# Patient Record
Sex: Male | Born: 1945
Health system: Southern US, Community
[De-identification: ages and names within clinical notes are randomized; demographics above are authoritative.]

## PROBLEM LIST (undated history)

## (undated) DIAGNOSIS — I10 Essential (primary) hypertension: Secondary | ICD-10-CM

## (undated) DIAGNOSIS — M810 Age-related osteoporosis without current pathological fracture: Secondary | ICD-10-CM

## (undated) HISTORY — PX: HERNIA REPAIR: SHX51

## (undated) HISTORY — PX: EYE SURGERY: SHX253

## (undated) HISTORY — PX: BLADDER SURGERY: SHX569

---

## 1999-10-01 ENCOUNTER — Encounter: Admission: RE | Admit: 1999-10-01 | Discharge: 1999-10-01 | Payer: Self-pay | Admitting: Internal Medicine

## 1999-10-01 ENCOUNTER — Encounter: Payer: Self-pay | Admitting: Internal Medicine

## 2002-06-24 ENCOUNTER — Ambulatory Visit (HOSPITAL_COMMUNITY): Admission: RE | Admit: 2002-06-24 | Discharge: 2002-06-24 | Payer: Self-pay | Admitting: *Deleted

## 2009-11-06 ENCOUNTER — Ambulatory Visit (HOSPITAL_BASED_OUTPATIENT_CLINIC_OR_DEPARTMENT_OTHER): Admission: RE | Admit: 2009-11-06 | Discharge: 2009-11-07 | Payer: Self-pay | Admitting: Urology

## 2010-06-08 ENCOUNTER — Other Ambulatory Visit: Payer: Self-pay | Admitting: Physician Assistant

## 2010-07-18 LAB — POCT I-STAT 4, (NA,K, GLUC, HGB,HCT)
Glucose, Bld: 95 mg/dL (ref 70–99)
HCT: 45 % (ref 39.0–52.0)
Hemoglobin: 15.3 g/dL (ref 13.0–17.0)
Potassium: 4.1 mEq/L (ref 3.5–5.1)
Sodium: 143 mEq/L (ref 135–145)

## 2011-08-10 DIAGNOSIS — R634 Abnormal weight loss: Secondary | ICD-10-CM | POA: Diagnosis not present

## 2011-08-10 DIAGNOSIS — I10 Essential (primary) hypertension: Secondary | ICD-10-CM | POA: Diagnosis not present

## 2011-08-10 DIAGNOSIS — E559 Vitamin D deficiency, unspecified: Secondary | ICD-10-CM | POA: Diagnosis not present

## 2011-08-10 DIAGNOSIS — Z79899 Other long term (current) drug therapy: Secondary | ICD-10-CM | POA: Diagnosis not present

## 2011-08-10 DIAGNOSIS — M810 Age-related osteoporosis without current pathological fracture: Secondary | ICD-10-CM | POA: Diagnosis not present

## 2011-10-28 DIAGNOSIS — Z7902 Long term (current) use of antithrombotics/antiplatelets: Secondary | ICD-10-CM | POA: Diagnosis not present

## 2011-10-28 DIAGNOSIS — M810 Age-related osteoporosis without current pathological fracture: Secondary | ICD-10-CM | POA: Diagnosis not present

## 2011-10-28 DIAGNOSIS — Z0000 Encounter for general adult medical examination without abnormal findings: Secondary | ICD-10-CM | POA: Diagnosis not present

## 2011-10-28 DIAGNOSIS — I10 Essential (primary) hypertension: Secondary | ICD-10-CM | POA: Diagnosis not present

## 2011-10-28 DIAGNOSIS — Z125 Encounter for screening for malignant neoplasm of prostate: Secondary | ICD-10-CM | POA: Diagnosis not present

## 2011-11-02 DIAGNOSIS — K449 Diaphragmatic hernia without obstruction or gangrene: Secondary | ICD-10-CM | POA: Diagnosis not present

## 2011-11-02 DIAGNOSIS — Z1212 Encounter for screening for malignant neoplasm of rectum: Secondary | ICD-10-CM | POA: Diagnosis not present

## 2011-11-02 DIAGNOSIS — Z87898 Personal history of other specified conditions: Secondary | ICD-10-CM | POA: Diagnosis not present

## 2011-11-02 DIAGNOSIS — I10 Essential (primary) hypertension: Secondary | ICD-10-CM | POA: Diagnosis not present

## 2011-11-02 DIAGNOSIS — Z0000 Encounter for general adult medical examination without abnormal findings: Secondary | ICD-10-CM | POA: Diagnosis not present

## 2011-11-02 DIAGNOSIS — M810 Age-related osteoporosis without current pathological fracture: Secondary | ICD-10-CM | POA: Diagnosis not present

## 2012-01-27 DIAGNOSIS — Z23 Encounter for immunization: Secondary | ICD-10-CM | POA: Diagnosis not present

## 2012-04-11 DIAGNOSIS — N3010 Interstitial cystitis (chronic) without hematuria: Secondary | ICD-10-CM | POA: Diagnosis not present

## 2012-06-22 DIAGNOSIS — J040 Acute laryngitis: Secondary | ICD-10-CM | POA: Diagnosis not present

## 2012-07-05 ENCOUNTER — Other Ambulatory Visit: Payer: Self-pay | Admitting: Physician Assistant

## 2012-07-05 DIAGNOSIS — L570 Actinic keratosis: Secondary | ICD-10-CM | POA: Diagnosis not present

## 2012-07-05 DIAGNOSIS — C44319 Basal cell carcinoma of skin of other parts of face: Secondary | ICD-10-CM | POA: Diagnosis not present

## 2012-07-05 DIAGNOSIS — D485 Neoplasm of uncertain behavior of skin: Secondary | ICD-10-CM | POA: Diagnosis not present

## 2012-07-05 DIAGNOSIS — L820 Inflamed seborrheic keratosis: Secondary | ICD-10-CM | POA: Diagnosis not present

## 2012-07-05 DIAGNOSIS — D2330 Other benign neoplasm of skin of unspecified part of face: Secondary | ICD-10-CM | POA: Diagnosis not present

## 2012-07-30 DIAGNOSIS — C44319 Basal cell carcinoma of skin of other parts of face: Secondary | ICD-10-CM | POA: Diagnosis not present

## 2012-08-01 DIAGNOSIS — C44319 Basal cell carcinoma of skin of other parts of face: Secondary | ICD-10-CM | POA: Diagnosis not present

## 2012-08-03 DIAGNOSIS — C44319 Basal cell carcinoma of skin of other parts of face: Secondary | ICD-10-CM | POA: Diagnosis not present

## 2012-08-06 DIAGNOSIS — C44319 Basal cell carcinoma of skin of other parts of face: Secondary | ICD-10-CM | POA: Diagnosis not present

## 2012-08-08 DIAGNOSIS — C44319 Basal cell carcinoma of skin of other parts of face: Secondary | ICD-10-CM | POA: Diagnosis not present

## 2012-08-10 DIAGNOSIS — C44319 Basal cell carcinoma of skin of other parts of face: Secondary | ICD-10-CM | POA: Diagnosis not present

## 2012-08-13 DIAGNOSIS — C44319 Basal cell carcinoma of skin of other parts of face: Secondary | ICD-10-CM | POA: Diagnosis not present

## 2012-08-15 DIAGNOSIS — C44319 Basal cell carcinoma of skin of other parts of face: Secondary | ICD-10-CM | POA: Diagnosis not present

## 2012-08-20 DIAGNOSIS — C44319 Basal cell carcinoma of skin of other parts of face: Secondary | ICD-10-CM | POA: Diagnosis not present

## 2012-08-22 DIAGNOSIS — C44319 Basal cell carcinoma of skin of other parts of face: Secondary | ICD-10-CM | POA: Diagnosis not present

## 2012-08-24 DIAGNOSIS — C44319 Basal cell carcinoma of skin of other parts of face: Secondary | ICD-10-CM | POA: Diagnosis not present

## 2012-08-27 DIAGNOSIS — C44319 Basal cell carcinoma of skin of other parts of face: Secondary | ICD-10-CM | POA: Diagnosis not present

## 2012-08-31 DIAGNOSIS — C44319 Basal cell carcinoma of skin of other parts of face: Secondary | ICD-10-CM | POA: Diagnosis not present

## 2012-10-01 ENCOUNTER — Other Ambulatory Visit: Payer: Self-pay | Admitting: Dermatology

## 2012-10-01 DIAGNOSIS — C44319 Basal cell carcinoma of skin of other parts of face: Secondary | ICD-10-CM | POA: Diagnosis not present

## 2012-11-22 ENCOUNTER — Other Ambulatory Visit: Payer: Self-pay | Admitting: Dermatology

## 2012-11-22 DIAGNOSIS — D0439 Carcinoma in situ of skin of other parts of face: Secondary | ICD-10-CM | POA: Diagnosis not present

## 2012-11-22 DIAGNOSIS — D0430 Carcinoma in situ of skin of unspecified part of face: Secondary | ICD-10-CM | POA: Diagnosis not present

## 2012-11-22 DIAGNOSIS — C44319 Basal cell carcinoma of skin of other parts of face: Secondary | ICD-10-CM | POA: Diagnosis not present

## 2013-01-17 DIAGNOSIS — E7800 Pure hypercholesterolemia, unspecified: Secondary | ICD-10-CM | POA: Diagnosis not present

## 2013-01-17 DIAGNOSIS — Z7982 Long term (current) use of aspirin: Secondary | ICD-10-CM | POA: Diagnosis not present

## 2013-01-17 DIAGNOSIS — I10 Essential (primary) hypertension: Secondary | ICD-10-CM | POA: Diagnosis not present

## 2013-01-17 DIAGNOSIS — Z125 Encounter for screening for malignant neoplasm of prostate: Secondary | ICD-10-CM | POA: Diagnosis not present

## 2013-01-17 DIAGNOSIS — Z0000 Encounter for general adult medical examination without abnormal findings: Secondary | ICD-10-CM | POA: Diagnosis not present

## 2013-01-18 DIAGNOSIS — Z23 Encounter for immunization: Secondary | ICD-10-CM | POA: Diagnosis not present

## 2013-01-23 DIAGNOSIS — I10 Essential (primary) hypertension: Secondary | ICD-10-CM | POA: Diagnosis not present

## 2013-01-23 DIAGNOSIS — M810 Age-related osteoporosis without current pathological fracture: Secondary | ICD-10-CM | POA: Diagnosis not present

## 2013-01-23 DIAGNOSIS — R350 Frequency of micturition: Secondary | ICD-10-CM | POA: Diagnosis not present

## 2013-01-23 DIAGNOSIS — Z7982 Long term (current) use of aspirin: Secondary | ICD-10-CM | POA: Diagnosis not present

## 2013-01-23 DIAGNOSIS — E291 Testicular hypofunction: Secondary | ICD-10-CM | POA: Diagnosis not present

## 2013-01-23 DIAGNOSIS — L570 Actinic keratosis: Secondary | ICD-10-CM | POA: Diagnosis not present

## 2013-01-23 DIAGNOSIS — Z1212 Encounter for screening for malignant neoplasm of rectum: Secondary | ICD-10-CM | POA: Diagnosis not present

## 2013-01-23 DIAGNOSIS — E7800 Pure hypercholesterolemia, unspecified: Secondary | ICD-10-CM | POA: Diagnosis not present

## 2013-02-13 DIAGNOSIS — H2510 Age-related nuclear cataract, unspecified eye: Secondary | ICD-10-CM | POA: Diagnosis not present

## 2013-02-13 DIAGNOSIS — H35039 Hypertensive retinopathy, unspecified eye: Secondary | ICD-10-CM | POA: Diagnosis not present

## 2013-02-26 ENCOUNTER — Other Ambulatory Visit: Payer: Self-pay | Admitting: Dermatology

## 2013-02-26 DIAGNOSIS — L570 Actinic keratosis: Secondary | ICD-10-CM | POA: Diagnosis not present

## 2013-02-26 DIAGNOSIS — D485 Neoplasm of uncertain behavior of skin: Secondary | ICD-10-CM | POA: Diagnosis not present

## 2013-03-04 DIAGNOSIS — D2310 Other benign neoplasm of skin of unspecified eyelid, including canthus: Secondary | ICD-10-CM | POA: Diagnosis not present

## 2013-03-19 DIAGNOSIS — I10 Essential (primary) hypertension: Secondary | ICD-10-CM | POA: Diagnosis not present

## 2013-03-19 DIAGNOSIS — M810 Age-related osteoporosis without current pathological fracture: Secondary | ICD-10-CM | POA: Diagnosis not present

## 2013-03-19 DIAGNOSIS — E291 Testicular hypofunction: Secondary | ICD-10-CM | POA: Diagnosis not present

## 2013-03-19 DIAGNOSIS — R972 Elevated prostate specific antigen [PSA]: Secondary | ICD-10-CM | POA: Diagnosis not present

## 2013-03-19 DIAGNOSIS — E559 Vitamin D deficiency, unspecified: Secondary | ICD-10-CM | POA: Diagnosis not present

## 2013-03-25 DIAGNOSIS — C44111 Basal cell carcinoma of skin of unspecified eyelid, including canthus: Secondary | ICD-10-CM | POA: Diagnosis not present

## 2013-03-25 DIAGNOSIS — H11439 Conjunctival hyperemia, unspecified eye: Secondary | ICD-10-CM | POA: Diagnosis not present

## 2013-03-25 DIAGNOSIS — H01029 Squamous blepharitis unspecified eye, unspecified eyelid: Secondary | ICD-10-CM | POA: Diagnosis not present

## 2013-03-25 DIAGNOSIS — H02879 Vascular anomalies of unspecified eye, unspecified eyelid: Secondary | ICD-10-CM | POA: Diagnosis not present

## 2013-03-26 DIAGNOSIS — H029 Unspecified disorder of eyelid: Secondary | ICD-10-CM | POA: Diagnosis not present

## 2013-04-01 DIAGNOSIS — D2310 Other benign neoplasm of skin of unspecified eyelid, including canthus: Secondary | ICD-10-CM | POA: Diagnosis not present

## 2013-04-22 DIAGNOSIS — C44111 Basal cell carcinoma of skin of unspecified eyelid, including canthus: Secondary | ICD-10-CM | POA: Diagnosis not present

## 2013-04-22 DIAGNOSIS — D485 Neoplasm of uncertain behavior of skin: Secondary | ICD-10-CM | POA: Diagnosis not present

## 2013-06-21 DIAGNOSIS — N3010 Interstitial cystitis (chronic) without hematuria: Secondary | ICD-10-CM | POA: Diagnosis not present

## 2013-06-21 DIAGNOSIS — C44101 Unspecified malignant neoplasm of skin of unspecified eyelid, including canthus: Secondary | ICD-10-CM | POA: Diagnosis not present

## 2013-07-15 DIAGNOSIS — H0289 Other specified disorders of eyelid: Secondary | ICD-10-CM | POA: Diagnosis not present

## 2013-07-15 DIAGNOSIS — Z483 Aftercare following surgery for neoplasm: Secondary | ICD-10-CM | POA: Diagnosis not present

## 2013-07-15 DIAGNOSIS — D485 Neoplasm of uncertain behavior of skin: Secondary | ICD-10-CM | POA: Diagnosis not present

## 2013-07-15 DIAGNOSIS — Z85828 Personal history of other malignant neoplasm of skin: Secondary | ICD-10-CM | POA: Diagnosis not present

## 2013-07-15 DIAGNOSIS — C44111 Basal cell carcinoma of skin of unspecified eyelid, including canthus: Secondary | ICD-10-CM | POA: Diagnosis not present

## 2013-07-15 DIAGNOSIS — M959 Acquired deformity of musculoskeletal system, unspecified: Secondary | ICD-10-CM | POA: Diagnosis not present

## 2013-07-15 DIAGNOSIS — Z481 Encounter for planned postprocedural wound closure: Secondary | ICD-10-CM | POA: Diagnosis not present

## 2013-08-12 DIAGNOSIS — H02539 Eyelid retraction unspecified eye, unspecified lid: Secondary | ICD-10-CM | POA: Diagnosis not present

## 2013-08-12 DIAGNOSIS — C44101 Unspecified malignant neoplasm of skin of unspecified eyelid, including canthus: Secondary | ICD-10-CM | POA: Diagnosis not present

## 2013-08-12 DIAGNOSIS — Z483 Aftercare following surgery for neoplasm: Secondary | ICD-10-CM | POA: Diagnosis not present

## 2013-08-12 DIAGNOSIS — Z85828 Personal history of other malignant neoplasm of skin: Secondary | ICD-10-CM | POA: Diagnosis not present

## 2013-09-03 DIAGNOSIS — L723 Sebaceous cyst: Secondary | ICD-10-CM | POA: Diagnosis not present

## 2013-11-21 ENCOUNTER — Other Ambulatory Visit: Payer: Self-pay | Admitting: Dermatology

## 2013-11-21 DIAGNOSIS — L570 Actinic keratosis: Secondary | ICD-10-CM | POA: Diagnosis not present

## 2013-11-21 DIAGNOSIS — D485 Neoplasm of uncertain behavior of skin: Secondary | ICD-10-CM | POA: Diagnosis not present

## 2013-11-21 DIAGNOSIS — L723 Sebaceous cyst: Secondary | ICD-10-CM | POA: Diagnosis not present

## 2013-11-21 DIAGNOSIS — L980 Pyogenic granuloma: Secondary | ICD-10-CM | POA: Diagnosis not present

## 2013-12-09 DIAGNOSIS — L905 Scar conditions and fibrosis of skin: Secondary | ICD-10-CM | POA: Diagnosis not present

## 2013-12-09 DIAGNOSIS — C6990 Malignant neoplasm of unspecified site of unspecified eye: Secondary | ICD-10-CM | POA: Diagnosis not present

## 2014-02-07 DIAGNOSIS — Z0000 Encounter for general adult medical examination without abnormal findings: Secondary | ICD-10-CM | POA: Diagnosis not present

## 2014-02-07 DIAGNOSIS — Z125 Encounter for screening for malignant neoplasm of prostate: Secondary | ICD-10-CM | POA: Diagnosis not present

## 2014-02-07 DIAGNOSIS — I10 Essential (primary) hypertension: Secondary | ICD-10-CM | POA: Diagnosis not present

## 2014-02-07 DIAGNOSIS — E780 Pure hypercholesterolemia: Secondary | ICD-10-CM | POA: Diagnosis not present

## 2014-02-07 DIAGNOSIS — Z23 Encounter for immunization: Secondary | ICD-10-CM | POA: Diagnosis not present

## 2014-02-07 DIAGNOSIS — M810 Age-related osteoporosis without current pathological fracture: Secondary | ICD-10-CM | POA: Diagnosis not present

## 2014-02-13 DIAGNOSIS — N3010 Interstitial cystitis (chronic) without hematuria: Secondary | ICD-10-CM | POA: Diagnosis not present

## 2014-02-13 DIAGNOSIS — Z7982 Long term (current) use of aspirin: Secondary | ICD-10-CM | POA: Diagnosis not present

## 2014-02-13 DIAGNOSIS — I10 Essential (primary) hypertension: Secondary | ICD-10-CM | POA: Diagnosis not present

## 2014-02-13 DIAGNOSIS — E291 Testicular hypofunction: Secondary | ICD-10-CM | POA: Diagnosis not present

## 2014-02-13 DIAGNOSIS — Z1212 Encounter for screening for malignant neoplasm of rectum: Secondary | ICD-10-CM | POA: Diagnosis not present

## 2014-05-14 DIAGNOSIS — E559 Vitamin D deficiency, unspecified: Secondary | ICD-10-CM | POA: Diagnosis not present

## 2014-05-14 DIAGNOSIS — M810 Age-related osteoporosis without current pathological fracture: Secondary | ICD-10-CM | POA: Diagnosis not present

## 2014-05-28 DIAGNOSIS — Z1211 Encounter for screening for malignant neoplasm of colon: Secondary | ICD-10-CM | POA: Diagnosis not present

## 2014-06-18 DIAGNOSIS — M542 Cervicalgia: Secondary | ICD-10-CM | POA: Diagnosis not present

## 2014-06-20 DIAGNOSIS — M5032 Other cervical disc degeneration, mid-cervical region: Secondary | ICD-10-CM | POA: Diagnosis not present

## 2014-06-20 DIAGNOSIS — M542 Cervicalgia: Secondary | ICD-10-CM | POA: Diagnosis not present

## 2014-06-24 DIAGNOSIS — M542 Cervicalgia: Secondary | ICD-10-CM | POA: Diagnosis not present

## 2014-06-24 DIAGNOSIS — M5032 Other cervical disc degeneration, mid-cervical region: Secondary | ICD-10-CM | POA: Diagnosis not present

## 2014-06-27 DIAGNOSIS — M542 Cervicalgia: Secondary | ICD-10-CM | POA: Diagnosis not present

## 2014-06-27 DIAGNOSIS — M5032 Other cervical disc degeneration, mid-cervical region: Secondary | ICD-10-CM | POA: Diagnosis not present

## 2014-07-01 DIAGNOSIS — M5032 Other cervical disc degeneration, mid-cervical region: Secondary | ICD-10-CM | POA: Diagnosis not present

## 2014-07-01 DIAGNOSIS — M542 Cervicalgia: Secondary | ICD-10-CM | POA: Diagnosis not present

## 2014-07-04 DIAGNOSIS — M5032 Other cervical disc degeneration, mid-cervical region: Secondary | ICD-10-CM | POA: Diagnosis not present

## 2014-07-04 DIAGNOSIS — M542 Cervicalgia: Secondary | ICD-10-CM | POA: Diagnosis not present

## 2014-07-22 DIAGNOSIS — H35033 Hypertensive retinopathy, bilateral: Secondary | ICD-10-CM | POA: Diagnosis not present

## 2014-07-22 DIAGNOSIS — H524 Presbyopia: Secondary | ICD-10-CM | POA: Diagnosis not present

## 2014-07-22 DIAGNOSIS — H2513 Age-related nuclear cataract, bilateral: Secondary | ICD-10-CM | POA: Diagnosis not present

## 2014-09-15 DIAGNOSIS — K2970 Gastritis, unspecified, without bleeding: Secondary | ICD-10-CM | POA: Diagnosis not present

## 2014-09-15 DIAGNOSIS — R112 Nausea with vomiting, unspecified: Secondary | ICD-10-CM | POA: Diagnosis not present

## 2014-10-06 DIAGNOSIS — J069 Acute upper respiratory infection, unspecified: Secondary | ICD-10-CM | POA: Diagnosis not present

## 2014-10-06 DIAGNOSIS — R55 Syncope and collapse: Secondary | ICD-10-CM | POA: Diagnosis not present

## 2015-02-13 DIAGNOSIS — Z23 Encounter for immunization: Secondary | ICD-10-CM | POA: Diagnosis not present

## 2015-03-16 DIAGNOSIS — E7800 Pure hypercholesterolemia, unspecified: Secondary | ICD-10-CM | POA: Diagnosis not present

## 2015-03-16 DIAGNOSIS — Z0000 Encounter for general adult medical examination without abnormal findings: Secondary | ICD-10-CM | POA: Diagnosis not present

## 2015-03-16 DIAGNOSIS — Z7982 Long term (current) use of aspirin: Secondary | ICD-10-CM | POA: Diagnosis not present

## 2015-03-16 DIAGNOSIS — M810 Age-related osteoporosis without current pathological fracture: Secondary | ICD-10-CM | POA: Diagnosis not present

## 2015-03-16 DIAGNOSIS — Z125 Encounter for screening for malignant neoplasm of prostate: Secondary | ICD-10-CM | POA: Diagnosis not present

## 2015-03-16 DIAGNOSIS — I10 Essential (primary) hypertension: Secondary | ICD-10-CM | POA: Diagnosis not present

## 2015-03-20 DIAGNOSIS — I10 Essential (primary) hypertension: Secondary | ICD-10-CM | POA: Diagnosis not present

## 2015-03-20 DIAGNOSIS — Z1212 Encounter for screening for malignant neoplasm of rectum: Secondary | ICD-10-CM | POA: Diagnosis not present

## 2015-03-20 DIAGNOSIS — E7800 Pure hypercholesterolemia, unspecified: Secondary | ICD-10-CM | POA: Diagnosis not present

## 2015-03-20 DIAGNOSIS — L570 Actinic keratosis: Secondary | ICD-10-CM | POA: Diagnosis not present

## 2015-03-20 DIAGNOSIS — M810 Age-related osteoporosis without current pathological fracture: Secondary | ICD-10-CM | POA: Diagnosis not present

## 2015-03-20 DIAGNOSIS — R972 Elevated prostate specific antigen [PSA]: Secondary | ICD-10-CM | POA: Diagnosis not present

## 2015-05-05 DIAGNOSIS — R972 Elevated prostate specific antigen [PSA]: Secondary | ICD-10-CM | POA: Diagnosis not present

## 2015-05-20 DIAGNOSIS — I10 Essential (primary) hypertension: Secondary | ICD-10-CM | POA: Diagnosis not present

## 2015-05-27 DIAGNOSIS — E291 Testicular hypofunction: Secondary | ICD-10-CM | POA: Diagnosis not present

## 2015-05-27 DIAGNOSIS — E559 Vitamin D deficiency, unspecified: Secondary | ICD-10-CM | POA: Diagnosis not present

## 2015-05-27 DIAGNOSIS — M810 Age-related osteoporosis without current pathological fracture: Secondary | ICD-10-CM | POA: Diagnosis not present

## 2015-09-09 DIAGNOSIS — H2513 Age-related nuclear cataract, bilateral: Secondary | ICD-10-CM | POA: Diagnosis not present

## 2015-09-09 DIAGNOSIS — H35033 Hypertensive retinopathy, bilateral: Secondary | ICD-10-CM | POA: Diagnosis not present

## 2015-12-23 ENCOUNTER — Other Ambulatory Visit: Payer: Self-pay | Admitting: Dermatology

## 2015-12-23 DIAGNOSIS — D0439 Carcinoma in situ of skin of other parts of face: Secondary | ICD-10-CM | POA: Diagnosis not present

## 2015-12-23 DIAGNOSIS — B078 Other viral warts: Secondary | ICD-10-CM | POA: Diagnosis not present

## 2015-12-23 DIAGNOSIS — C44319 Basal cell carcinoma of skin of other parts of face: Secondary | ICD-10-CM | POA: Diagnosis not present

## 2015-12-23 DIAGNOSIS — L723 Sebaceous cyst: Secondary | ICD-10-CM | POA: Diagnosis not present

## 2015-12-23 DIAGNOSIS — L821 Other seborrheic keratosis: Secondary | ICD-10-CM | POA: Diagnosis not present

## 2015-12-23 DIAGNOSIS — D239 Other benign neoplasm of skin, unspecified: Secondary | ICD-10-CM | POA: Diagnosis not present

## 2015-12-23 DIAGNOSIS — C44311 Basal cell carcinoma of skin of nose: Secondary | ICD-10-CM | POA: Diagnosis not present

## 2015-12-23 DIAGNOSIS — D485 Neoplasm of uncertain behavior of skin: Secondary | ICD-10-CM | POA: Diagnosis not present

## 2016-01-15 DIAGNOSIS — Z23 Encounter for immunization: Secondary | ICD-10-CM | POA: Diagnosis not present

## 2016-02-04 DIAGNOSIS — D0439 Carcinoma in situ of skin of other parts of face: Secondary | ICD-10-CM | POA: Diagnosis not present

## 2016-03-28 DIAGNOSIS — M810 Age-related osteoporosis without current pathological fracture: Secondary | ICD-10-CM | POA: Diagnosis not present

## 2016-03-28 DIAGNOSIS — Z125 Encounter for screening for malignant neoplasm of prostate: Secondary | ICD-10-CM | POA: Diagnosis not present

## 2016-03-28 DIAGNOSIS — Z7982 Long term (current) use of aspirin: Secondary | ICD-10-CM | POA: Diagnosis not present

## 2016-03-28 DIAGNOSIS — E7800 Pure hypercholesterolemia, unspecified: Secondary | ICD-10-CM | POA: Diagnosis not present

## 2016-03-28 DIAGNOSIS — Z0000 Encounter for general adult medical examination without abnormal findings: Secondary | ICD-10-CM | POA: Diagnosis not present

## 2016-03-28 DIAGNOSIS — I10 Essential (primary) hypertension: Secondary | ICD-10-CM | POA: Diagnosis not present

## 2016-03-31 DIAGNOSIS — Z23 Encounter for immunization: Secondary | ICD-10-CM | POA: Diagnosis not present

## 2016-03-31 DIAGNOSIS — I10 Essential (primary) hypertension: Secondary | ICD-10-CM | POA: Diagnosis not present

## 2016-03-31 DIAGNOSIS — R972 Elevated prostate specific antigen [PSA]: Secondary | ICD-10-CM | POA: Diagnosis not present

## 2016-03-31 DIAGNOSIS — K449 Diaphragmatic hernia without obstruction or gangrene: Secondary | ICD-10-CM | POA: Diagnosis not present

## 2016-03-31 DIAGNOSIS — E291 Testicular hypofunction: Secondary | ICD-10-CM | POA: Diagnosis not present

## 2016-04-07 ENCOUNTER — Other Ambulatory Visit: Payer: Self-pay | Admitting: Dermatology

## 2016-04-07 DIAGNOSIS — L905 Scar conditions and fibrosis of skin: Secondary | ICD-10-CM | POA: Diagnosis not present

## 2016-04-07 DIAGNOSIS — C44319 Basal cell carcinoma of skin of other parts of face: Secondary | ICD-10-CM | POA: Diagnosis not present

## 2016-04-14 DIAGNOSIS — Z4802 Encounter for removal of sutures: Secondary | ICD-10-CM | POA: Diagnosis not present

## 2016-08-24 ENCOUNTER — Other Ambulatory Visit: Payer: Self-pay | Admitting: Dermatology

## 2016-08-24 DIAGNOSIS — L570 Actinic keratosis: Secondary | ICD-10-CM | POA: Diagnosis not present

## 2016-08-24 DIAGNOSIS — C44319 Basal cell carcinoma of skin of other parts of face: Secondary | ICD-10-CM | POA: Diagnosis not present

## 2016-10-05 ENCOUNTER — Emergency Department (HOSPITAL_BASED_OUTPATIENT_CLINIC_OR_DEPARTMENT_OTHER)
Admission: EM | Admit: 2016-10-05 | Discharge: 2016-10-05 | Disposition: A | Discharge status: Home / Self Care | Payer: Medicare Other | Attending: Emergency Medicine | Admitting: Emergency Medicine

## 2016-10-05 ENCOUNTER — Encounter (HOSPITAL_BASED_OUTPATIENT_CLINIC_OR_DEPARTMENT_OTHER): Payer: Self-pay

## 2016-10-05 ENCOUNTER — Emergency Department (HOSPITAL_BASED_OUTPATIENT_CLINIC_OR_DEPARTMENT_OTHER): Payer: Medicare Other

## 2016-10-05 DIAGNOSIS — S2231XA Fracture of one rib, right side, initial encounter for closed fracture: Secondary | ICD-10-CM | POA: Insufficient documentation

## 2016-10-05 DIAGNOSIS — Y999 Unspecified external cause status: Secondary | ICD-10-CM | POA: Diagnosis not present

## 2016-10-05 DIAGNOSIS — X509XXA Other and unspecified overexertion or strenuous movements or postures, initial encounter: Secondary | ICD-10-CM | POA: Diagnosis not present

## 2016-10-05 DIAGNOSIS — Z79899 Other long term (current) drug therapy: Secondary | ICD-10-CM | POA: Diagnosis not present

## 2016-10-05 DIAGNOSIS — I10 Essential (primary) hypertension: Secondary | ICD-10-CM | POA: Diagnosis not present

## 2016-10-05 DIAGNOSIS — W19XXXA Unspecified fall, initial encounter: Secondary | ICD-10-CM

## 2016-10-05 DIAGNOSIS — Y929 Unspecified place or not applicable: Secondary | ICD-10-CM | POA: Insufficient documentation

## 2016-10-05 DIAGNOSIS — S4991XA Unspecified injury of right shoulder and upper arm, initial encounter: Secondary | ICD-10-CM | POA: Diagnosis not present

## 2016-10-05 DIAGNOSIS — Y939 Activity, unspecified: Secondary | ICD-10-CM | POA: Diagnosis not present

## 2016-10-05 DIAGNOSIS — M25511 Pain in right shoulder: Secondary | ICD-10-CM | POA: Diagnosis not present

## 2016-10-05 HISTORY — DX: Essential (primary) hypertension: I10

## 2016-10-05 HISTORY — DX: Age-related osteoporosis without current pathological fracture: M81.0

## 2016-10-05 MED ORDER — HYDROCODONE-ACETAMINOPHEN 5-325 MG PO TABS: 1.0000 | ORAL_TABLET | Freq: Four times a day (QID) | ORAL | 0 refills | Status: AC | PRN

## 2016-10-05 NOTE — Discharge Instructions (Signed)
Take pain medicine as needed. Using incentive spirometer daily. Expect some discomfort for the next several weeks in particular the next 2 weeks. Return for any signs of pneumonia.

## 2016-10-05 NOTE — ED Notes (Signed)
ED Provider at bedside. 

## 2016-10-05 NOTE — ED Triage Notes (Addendum)
Pt states he fell while hanging onto edge of trailer-pain to right scapula, right shoulder-NAD-steady gait

## 2016-10-05 NOTE — ED Provider Notes (Signed)
[facility] Provider Note   CSN: [id number] Arrival date & time: 10/05/16  1905  By signing my name below, I, Reola Mosher, attest that this documentation has been prepared under the direction and in the presence of Fredia Sorrow, MD. Electronically Signed: Reola Mosher, ED Scribe. 10/05/16. 10:23 PM.  History   Chief Complaint Chief Complaint  Patient presents with  . Fall   The history is provided by the patient. No language interpreter was used.    HPI Comments: David Fuentes is a 71 y.o. male with a PMHx of osteoporosis and HTN, who presents to the Emergency Department complaining of sudden onset, improving right shoulder and right scapular pain s/p mechanical fall which occurred prior to arrival. Per pt, he was stepping off of a trailer when he fell down a ramp, striking the right shoulder. No head injury or LOC. He is c/o pain to the right shoulder and scapular region which is worse with movement of the right arm. No noted treatments for his symptoms were tried prior to coming into the ED. His pain is mildly worse with twisting motions. He endorses some neck pain at baseline d/t his arthritis; no acute changes recently or s/p fall. Pt denies fever, chills, congestion, rhinorrhea, sore throat, visual disturbance, cough, shortness of breath, leg swelling, abdominal pain, diarrhea, nausea, vomiting, dysuria, hematuria, neck pain, rash, headache, bruising/bleeding easily. Pt is not currently on anticoagulants.   Past Medical History:  Diagnosis Date  . Hypertension   . Osteoporosis    There are no active problems to display for this patient.  Past Surgical History:  Procedure Laterality Date  . BLADDER SURGERY    . EYE SURGERY    . HERNIA REPAIR      Home Medications    Prior to Admission medications   Medication Sig Start Date End Date Taking? Authorizing Provider  AMLODIPINE BESYLATE PO Take by mouth.   Yes [provider]  aspirin EC 81  MG tablet Take 81 mg by mouth daily.   Yes [provider]  BENAZEPRIL HCL PO Take by mouth.   Yes [provider]  HYDROcodone-acetaminophen (NORCO/VICODIN) 5-325 MG tablet Take 1-2 tablets by mouth every 6 (six) hours as needed for moderate pain. 10/05/16   Fredia Sorrow, MD   Family History No family history on file.  Social History Social History  Substance Use Topics  . Smoking status: Never Smoker  . Smokeless tobacco: Never Used  . Alcohol use No   Allergies   Chocolate  Review of Systems Review of Systems  Constitutional: Negative for chills and fever.  HENT: Negative for congestion, rhinorrhea and sore throat.   Eyes: Negative for visual disturbance.  Respiratory: Negative for cough and shortness of breath.   Cardiovascular: Negative for leg swelling.  Gastrointestinal: Negative for abdominal pain, diarrhea, nausea and vomiting.  Genitourinary: Negative for dysuria and hematuria.  Musculoskeletal: Positive for arthralgias, back pain, myalgias and neck pain (baseline).  Skin: Negative for rash.  Neurological: Negative for syncope and headaches.  Hematological: Does not bruise/bleed easily.   Physical Exam Updated Vital Signs BP (!) 169/75 (BP Location: Left Arm)   Pulse 80   Temp 99.1 F (37.3 C) (Oral)   Resp 18   Ht 1.778 m (5\' 10" )   Wt 65.3 kg (144 lb)   SpO2 99%   BMI 20.66 kg/m   Physical Exam  Constitutional: He is oriented to person, place, and time. He appears well-developed and well-nourished.  HENT:  Head: Normocephalic.  Mouth/Throat: Oropharynx is clear and moist.  Abrasion to the top of the head which is 1cm. This is apparently from prior to today.   Eyes: Conjunctivae and EOM are normal. Pupils are equal, round, and reactive to light. Right eye exhibits no discharge. Left eye exhibits no discharge. No scleral icterus.  Cardiovascular: Normal rate, regular rhythm and normal heart sounds.   No murmur heard. Pulses:       Radial pulses are 2+ on the right side, and 2+ on the left side.  Pulmonary/Chest: Effort normal and breath sounds normal. No respiratory distress. He has no wheezes. He has no rales.  Abdominal: Soft. Bowel sounds are normal. He exhibits no distension. There is no tenderness.  Musculoskeletal: Normal range of motion. He exhibits no edema.  Right scapula is stable. No tenderness to the right scapular area.   Neurological: He is alert and oriented to person, place, and time. No cranial nerve deficit. He exhibits normal muscle tone. Coordination normal.  Skin: Skin is warm and dry.  Psychiatric: He has a normal mood and affect.  Nursing note and vitals reviewed.  ED Treatments / Results  DIAGNOSTIC STUDIES: Oxygen Saturation is 99% on RA, normal by my interpretation.   COORDINATION OF CARE: 10:22 PM-Discussed next steps with pt. Pt verbalized understanding and is agreeable with the plan.   Labs (all labs ordered are listed, but only abnormal results are displayed) Labs Reviewed - No data to display  EKG  EKG Interpretation None      Radiology Dg Scapula Right  Result Date: 10/05/2016 CLINICAL DATA:  Fall x today injuring Rt scapula with pain entirely. No old injury known. EXAM: RIGHT SCAPULA - 2+ VIEWS COMPARISON:  None. FINDINGS: Minimally displaced fracture deformity of the lateral aspect right fifth rib. No pneumothorax. Scapula and shoulder appear unremarkable. IMPRESSION: 1. Minimally displaced right fifth rib fracture deformity. Electronically Signed   By: Lucrezia Europe M.D.   On: 10/05/2016 20:17   Dg Shoulder Right  Result Date: 10/05/2016 CLINICAL DATA:  Fall x today injuring Rt shoulder with pain entirely. No old injury known . Lateral Y view performed with Scapula images EXAM: RIGHT SHOULDER - 2+ VIEW COMPARISON:  None. FINDINGS: There is no evidence of fracture or dislocation. There is no evidence of arthropathy or other focal bone abnormality. Soft tissues are unremarkable.  IMPRESSION: Negative. Electronically Signed   By: Lucrezia Europe M.D.   On: 10/05/2016 20:17   Procedures Procedures   Medications Ordered in ED Medications - No data to display  Initial Impression / Assessment and Plan / ED Course  I have reviewed the triage vital signs and the nursing notes.  Pertinent labs & imaging results that were available during my care of the patient were reviewed by me and considered in my medical decision making (see chart for details).     Patient with a fall this afternoon. X-ray showed no evidence of any shoulder injury or scapular injury. But did show evidence of the right fifth rib fracture. Explains patient pain. No loss of consciousness. No other significant injuries. Will treat with pain medication and incentive spirometer.  Final Clinical Impressions(s) / ED Diagnoses   Final diagnoses:  Fall, initial encounter  Closed fracture of one rib of right side, initial encounter   New Prescriptions New Prescriptions   HYDROCODONE-ACETAMINOPHEN (NORCO/VICODIN) 5-325 MG TABLET    Take 1-2 tablets by mouth every 6 (six) hours as needed for moderate pain.  I personally performed the services described in this documentation, which was scribed in my presence. The recorded information has been reviewed and is accurate.       Fredia Sorrow, MD 10/05/16 [phone]

## 2016-10-17 DIAGNOSIS — C44319 Basal cell carcinoma of skin of other parts of face: Secondary | ICD-10-CM | POA: Diagnosis not present

## 2016-10-28 DIAGNOSIS — S2231XD Fracture of one rib, right side, subsequent encounter for fracture with routine healing: Secondary | ICD-10-CM | POA: Diagnosis not present

## 2016-11-14 DIAGNOSIS — C44199 Other specified malignant neoplasm of skin of left eyelid, including canthus: Secondary | ICD-10-CM | POA: Diagnosis not present

## 2016-12-08 DIAGNOSIS — S2231XD Fracture of one rib, right side, subsequent encounter for fracture with routine healing: Secondary | ICD-10-CM | POA: Diagnosis not present

## 2016-12-08 DIAGNOSIS — Z85828 Personal history of other malignant neoplasm of skin: Secondary | ICD-10-CM | POA: Diagnosis not present

## 2016-12-08 DIAGNOSIS — Z7982 Long term (current) use of aspirin: Secondary | ICD-10-CM | POA: Diagnosis not present

## 2016-12-08 DIAGNOSIS — Z8262 Family history of osteoporosis: Secondary | ICD-10-CM | POA: Diagnosis not present

## 2016-12-08 DIAGNOSIS — R634 Abnormal weight loss: Secondary | ICD-10-CM | POA: Diagnosis not present

## 2016-12-08 DIAGNOSIS — I10 Essential (primary) hypertension: Secondary | ICD-10-CM | POA: Diagnosis not present

## 2016-12-08 DIAGNOSIS — E291 Testicular hypofunction: Secondary | ICD-10-CM | POA: Diagnosis not present

## 2016-12-08 DIAGNOSIS — R0602 Shortness of breath: Secondary | ICD-10-CM | POA: Diagnosis not present

## 2016-12-08 DIAGNOSIS — R972 Elevated prostate specific antigen [PSA]: Secondary | ICD-10-CM | POA: Diagnosis not present

## 2016-12-08 DIAGNOSIS — E559 Vitamin D deficiency, unspecified: Secondary | ICD-10-CM | POA: Diagnosis not present

## 2016-12-08 DIAGNOSIS — M810 Age-related osteoporosis without current pathological fracture: Secondary | ICD-10-CM | POA: Diagnosis not present

## 2017-02-10 DIAGNOSIS — Z23 Encounter for immunization: Secondary | ICD-10-CM | POA: Diagnosis not present

## 2017-04-03 DIAGNOSIS — E7800 Pure hypercholesterolemia, unspecified: Secondary | ICD-10-CM | POA: Diagnosis not present

## 2017-04-03 DIAGNOSIS — I10 Essential (primary) hypertension: Secondary | ICD-10-CM | POA: Diagnosis not present

## 2017-04-03 DIAGNOSIS — Z0000 Encounter for general adult medical examination without abnormal findings: Secondary | ICD-10-CM | POA: Diagnosis not present

## 2017-04-03 DIAGNOSIS — M810 Age-related osteoporosis without current pathological fracture: Secondary | ICD-10-CM | POA: Diagnosis not present

## 2017-04-03 DIAGNOSIS — Z125 Encounter for screening for malignant neoplasm of prostate: Secondary | ICD-10-CM | POA: Diagnosis not present

## 2017-04-03 DIAGNOSIS — Z7982 Long term (current) use of aspirin: Secondary | ICD-10-CM | POA: Diagnosis not present

## 2017-04-07 DIAGNOSIS — C44319 Basal cell carcinoma of skin of other parts of face: Secondary | ICD-10-CM | POA: Diagnosis not present

## 2017-04-07 DIAGNOSIS — Z7982 Long term (current) use of aspirin: Secondary | ICD-10-CM | POA: Diagnosis not present

## 2017-04-07 DIAGNOSIS — Z1212 Encounter for screening for malignant neoplasm of rectum: Secondary | ICD-10-CM | POA: Diagnosis not present

## 2017-04-07 DIAGNOSIS — E291 Testicular hypofunction: Secondary | ICD-10-CM | POA: Diagnosis not present

## 2017-04-07 DIAGNOSIS — M810 Age-related osteoporosis without current pathological fracture: Secondary | ICD-10-CM | POA: Diagnosis not present

## 2017-04-07 DIAGNOSIS — K449 Diaphragmatic hernia without obstruction or gangrene: Secondary | ICD-10-CM | POA: Diagnosis not present

## 2017-04-07 DIAGNOSIS — K640 First degree hemorrhoids: Secondary | ICD-10-CM | POA: Diagnosis not present

## 2017-04-07 DIAGNOSIS — E7800 Pure hypercholesterolemia, unspecified: Secondary | ICD-10-CM | POA: Diagnosis not present

## 2017-04-07 DIAGNOSIS — I10 Essential (primary) hypertension: Secondary | ICD-10-CM | POA: Diagnosis not present

## 2017-04-07 DIAGNOSIS — N3010 Interstitial cystitis (chronic) without hematuria: Secondary | ICD-10-CM | POA: Diagnosis not present

## 2017-04-07 DIAGNOSIS — I8393 Asymptomatic varicose veins of bilateral lower extremities: Secondary | ICD-10-CM | POA: Diagnosis not present

## 2017-04-07 DIAGNOSIS — R972 Elevated prostate specific antigen [PSA]: Secondary | ICD-10-CM | POA: Diagnosis not present

## 2017-04-19 ENCOUNTER — Other Ambulatory Visit: Payer: Self-pay | Admitting: Dermatology

## 2017-04-19 DIAGNOSIS — L821 Other seborrheic keratosis: Secondary | ICD-10-CM | POA: Diagnosis not present

## 2017-04-19 DIAGNOSIS — C44319 Basal cell carcinoma of skin of other parts of face: Secondary | ICD-10-CM | POA: Diagnosis not present

## 2017-04-19 DIAGNOSIS — L570 Actinic keratosis: Secondary | ICD-10-CM | POA: Diagnosis not present

## 2017-04-19 DIAGNOSIS — D229 Melanocytic nevi, unspecified: Secondary | ICD-10-CM | POA: Diagnosis not present

## 2017-05-18 DIAGNOSIS — L570 Actinic keratosis: Secondary | ICD-10-CM | POA: Diagnosis not present

## 2017-05-18 DIAGNOSIS — C44319 Basal cell carcinoma of skin of other parts of face: Secondary | ICD-10-CM | POA: Diagnosis not present

## 2017-07-26 ENCOUNTER — Other Ambulatory Visit: Payer: Self-pay | Admitting: Dermatology

## 2017-07-26 DIAGNOSIS — D485 Neoplasm of uncertain behavior of skin: Secondary | ICD-10-CM | POA: Diagnosis not present

## 2017-07-26 DIAGNOSIS — D0439 Carcinoma in situ of skin of other parts of face: Secondary | ICD-10-CM | POA: Diagnosis not present

## 2017-07-26 DIAGNOSIS — D2239 Melanocytic nevi of other parts of face: Secondary | ICD-10-CM | POA: Diagnosis not present

## 2017-08-29 ENCOUNTER — Other Ambulatory Visit: Payer: Self-pay | Admitting: Dermatology

## 2017-08-29 DIAGNOSIS — D485 Neoplasm of uncertain behavior of skin: Secondary | ICD-10-CM | POA: Diagnosis not present

## 2017-08-29 DIAGNOSIS — L739 Follicular disorder, unspecified: Secondary | ICD-10-CM | POA: Diagnosis not present

## 2017-08-29 DIAGNOSIS — D0439 Carcinoma in situ of skin of other parts of face: Secondary | ICD-10-CM | POA: Diagnosis not present

## 2017-09-07 DIAGNOSIS — Z79899 Other long term (current) drug therapy: Secondary | ICD-10-CM | POA: Diagnosis not present

## 2017-09-07 DIAGNOSIS — I10 Essential (primary) hypertension: Secondary | ICD-10-CM | POA: Diagnosis not present

## 2017-09-07 DIAGNOSIS — M818 Other osteoporosis without current pathological fracture: Secondary | ICD-10-CM | POA: Diagnosis not present

## 2017-09-07 DIAGNOSIS — Z7982 Long term (current) use of aspirin: Secondary | ICD-10-CM | POA: Diagnosis not present

## 2017-09-07 DIAGNOSIS — M810 Age-related osteoporosis without current pathological fracture: Secondary | ICD-10-CM | POA: Diagnosis not present

## 2017-09-07 DIAGNOSIS — E559 Vitamin D deficiency, unspecified: Secondary | ICD-10-CM | POA: Diagnosis not present

## 2017-09-07 DIAGNOSIS — Z8262 Family history of osteoporosis: Secondary | ICD-10-CM | POA: Diagnosis not present

## 2017-10-27 ENCOUNTER — Encounter (HOSPITAL_BASED_OUTPATIENT_CLINIC_OR_DEPARTMENT_OTHER): Payer: Self-pay | Admitting: Emergency Medicine

## 2017-10-27 ENCOUNTER — Other Ambulatory Visit: Payer: Self-pay

## 2017-10-27 ENCOUNTER — Emergency Department (HOSPITAL_BASED_OUTPATIENT_CLINIC_OR_DEPARTMENT_OTHER)
Admission: EM | Admit: 2017-10-27 | Discharge: 2017-10-27 | Disposition: A | Discharge status: Home / Self Care | Payer: Medicare Other | Attending: Emergency Medicine | Admitting: Emergency Medicine

## 2017-10-27 DIAGNOSIS — L089 Local infection of the skin and subcutaneous tissue, unspecified: Secondary | ICD-10-CM

## 2017-10-27 DIAGNOSIS — I10 Essential (primary) hypertension: Secondary | ICD-10-CM | POA: Insufficient documentation

## 2017-10-27 DIAGNOSIS — R222 Localized swelling, mass and lump, trunk: Secondary | ICD-10-CM | POA: Diagnosis present

## 2017-10-27 DIAGNOSIS — L720 Epidermal cyst: Secondary | ICD-10-CM | POA: Insufficient documentation

## 2017-10-27 DIAGNOSIS — Z7982 Long term (current) use of aspirin: Secondary | ICD-10-CM | POA: Insufficient documentation

## 2017-10-27 DIAGNOSIS — R21 Rash and other nonspecific skin eruption: Secondary | ICD-10-CM | POA: Diagnosis not present

## 2017-10-27 MED ORDER — DOXYCYCLINE HYCLATE 100 MG PO CAPS: 100.0000 mg | ORAL_CAPSULE | Freq: Two times a day (BID) | ORAL | 0 refills | Status: DC

## 2017-10-27 MED ORDER — LIDOCAINE-EPINEPHRINE 2 %-1:200000 IJ SOLN
10.0000 mL | Freq: Once | INTRAMUSCULAR | Status: AC
Start: 2017-10-27 — End: 2017-10-27
  Administered 2017-10-27: 10 mL via INTRADERMAL
  Filled 2017-10-27 (×2): qty 10

## 2017-10-27 MED FILL — DOXYCYCLINE HYCLATE 100 MG: 100 | 7 days supply | Qty: 14 | Fill #0

## 2017-10-27 NOTE — ED Provider Notes (Signed)
[facility] EM[facility] DEPARTMENT Provider Note   CSN: [id number] Arrival date & time: 10/27/17  0946     History   Chief Complaint Chief Complaint  Patient presents with  . Abscess    HPI David Fuentes is a 72 y.o. male.  HPI   72 year old male presenting for evaluation of a potential abscess and on his back.  Patient has a spot to his upper back which is been there for the past year.  His dermatologist is aware.  However for the past 2 weeks it has increased in size become more tender and red.  Pain is sharp throbbing moderate in severity nonradiating.  No fever, chills, shortness of breath or chest pain or numbness.  He is up-to-date with tetanus.  No specific treatment tried.  Past Medical History:  Diagnosis Date  . Hypertension   . Osteoporosis     There are no active problems to display for this patient.   Past Surgical History:  Procedure Laterality Date  . BLADDER SURGERY    . EYE SURGERY    . HERNIA REPAIR          Home Medications    Prior to Admission medications   Medication Sig Start Date End Date Taking? Authorizing Provider  AMLODIPINE BESYLATE PO Take by mouth.    [provider]  aspirin EC 81 MG tablet Take 81 mg by mouth daily.    [provider]  BENAZEPRIL HCL PO Take by mouth.    [provider]  HYDROcodone-acetaminophen (NORCO/VICODIN) 5-325 MG tablet Take 1-2 tablets by mouth every 6 (six) hours as needed for moderate pain. 10/05/16   Fredia Sorrow, MD    Family History No family history on file.  Social History Social History   Tobacco Use  . Smoking status: Never Smoker  . Smokeless tobacco: Never Used  Substance Use Topics  . Alcohol use: No  . Drug use: No     Allergies   Chocolate   Review of Systems Review of Systems  Constitutional: Negative for fever.  Skin: Positive for rash.     Physical Exam Updated Vital Signs BP 134/66 (BP Location: Left Arm)   Pulse 70   Temp  98.7 F (37.1 C) (Oral)   Resp 16   Ht 5\' 10"  (1.778 m)   Wt 67.1 kg (148 lb)   SpO2 100%   BMI 21.24 kg/m   Physical Exam  Constitutional: He appears well-developed and well-nourished. No distress.  HENT:  Head: Atraumatic.  Eyes: Conjunctivae are normal.  Neck: Neck supple.  Neurological: He is alert.  Skin: Rash (Mid upper back there is an area of induration and fluctuant approximately 4 cm in diameter tender to palpation.) noted.  Psychiatric: He has a normal mood and affect.  Nursing note and vitals reviewed.    ED Treatments / Results  Labs (all labs ordered are listed, but only abnormal results are displayed) Labs Reviewed - No data to display  EKG None  Radiology No results found.  Procedures .Marland KitchenIncision and Drainage Date/Time: 10/27/2017 12:40 PM Performed by: Domenic Moras, PA-C Authorized by: Domenic Moras, PA-C   Consent:    Consent obtained:  Verbal   Consent given by:  Patient   Risks discussed:  Incomplete drainage and pain   Alternatives discussed:  No treatment Location:    Type:  Abscess   Size:  4cm Pre-procedure details:    Skin preparation:  Betadine Anesthesia (see MAR for exact dosages):  Anesthesia method:  Local infiltration   Local anesthetic:  Lidocaine 2% WITH epi Procedure type:    Complexity:  Complex Procedure details:    Incision types:  Stab incision   Incision depth:  Subcutaneous   Scalpel blade:  11   Wound management:  Probed and deloculated, irrigated with saline and extensive cleaning   Drainage:  Bloody and purulent   Drainage amount:  Copious   Wound treatment:  Wound left open   Packing materials:  1/4 in iodoform gauze   Amount 1/4" iodoform:  8 Post-procedure details:    Patient tolerance of procedure:  Tolerated well, no immediate complications   (including critical care time)  Medications Ordered in ED Medications - No data to display   Initial Impression / Assessment and Plan / ED Course  I have  reviewed the triage vital signs and the nursing notes.  Pertinent labs & imaging results that were available during my care of the patient were reviewed by me and considered in my medical decision making (see chart for details).     BP 134/66 (BP Location: Left Arm)   Pulse 70   Temp 98.7 F (37.1 C) (Oral)   Resp 16   Ht 5\' 10"  (1.778 m)   Wt 67.1 kg (148 lb)   SpO2 100%   BMI 21.24 kg/m    Final Clinical Impressions(s) / ED Diagnoses   Final diagnoses:  Infected epidermoid cyst    ED Discharge Orders        Ordered    doxycycline (VIBRAMYCIN) 100 MG capsule  2 times daily     10/27/17 1305     12:45 PM Pt with an infected epidermal cyst to mid upper back, with successful I&D by me.  Packing placed.  Pt d/c with abx, and return in 48 hrs for wound recheck and packing removal.  Care discussed with Dr. Sherry Ruffing.    Domenic Moras, PA-C 10/27/17 1307    Tegeler, Gwenyth Allegra, MD 10/28/17 2019

## 2017-10-27 NOTE — Discharge Instructions (Signed)
You have an infected epidermal cyst that was incised and drained.  Apply warm compress to the affected area several times a day.  Follow up with your provider in 48 hrs for wound recheck and packing removal.  Take antibiotic as prescribed.

## 2017-10-27 NOTE — ED Triage Notes (Signed)
Abscess to back.  Has had area for over several months, worse last week.  Unable to see pcp today.

## 2017-11-01 DIAGNOSIS — L723 Sebaceous cyst: Secondary | ICD-10-CM | POA: Diagnosis not present

## 2017-11-23 DIAGNOSIS — L723 Sebaceous cyst: Secondary | ICD-10-CM | POA: Diagnosis not present

## 2017-12-26 ENCOUNTER — Other Ambulatory Visit: Payer: Self-pay | Admitting: Dermatology

## 2017-12-26 DIAGNOSIS — L720 Epidermal cyst: Secondary | ICD-10-CM | POA: Diagnosis not present

## 2017-12-26 DIAGNOSIS — B078 Other viral warts: Secondary | ICD-10-CM | POA: Diagnosis not present

## 2017-12-26 DIAGNOSIS — D229 Melanocytic nevi, unspecified: Secondary | ICD-10-CM | POA: Diagnosis not present

## 2017-12-26 DIAGNOSIS — D485 Neoplasm of uncertain behavior of skin: Secondary | ICD-10-CM | POA: Diagnosis not present

## 2017-12-26 DIAGNOSIS — C44329 Squamous cell carcinoma of skin of other parts of face: Secondary | ICD-10-CM | POA: Diagnosis not present

## 2018-01-02 DIAGNOSIS — H35033 Hypertensive retinopathy, bilateral: Secondary | ICD-10-CM | POA: Diagnosis not present

## 2018-02-09 DIAGNOSIS — Z23 Encounter for immunization: Secondary | ICD-10-CM | POA: Diagnosis not present

## 2018-02-15 ENCOUNTER — Other Ambulatory Visit: Payer: Self-pay | Admitting: Dermatology

## 2018-02-15 DIAGNOSIS — D0439 Carcinoma in situ of skin of other parts of face: Secondary | ICD-10-CM | POA: Diagnosis not present

## 2018-02-15 DIAGNOSIS — L570 Actinic keratosis: Secondary | ICD-10-CM | POA: Diagnosis not present

## 2018-02-15 DIAGNOSIS — D485 Neoplasm of uncertain behavior of skin: Secondary | ICD-10-CM | POA: Diagnosis not present

## 2018-02-15 DIAGNOSIS — C44329 Squamous cell carcinoma of skin of other parts of face: Secondary | ICD-10-CM | POA: Diagnosis not present

## 2018-04-06 DIAGNOSIS — I10 Essential (primary) hypertension: Secondary | ICD-10-CM | POA: Diagnosis not present

## 2018-04-06 DIAGNOSIS — Z7982 Long term (current) use of aspirin: Secondary | ICD-10-CM | POA: Diagnosis not present

## 2018-04-06 DIAGNOSIS — M810 Age-related osteoporosis without current pathological fracture: Secondary | ICD-10-CM | POA: Diagnosis not present

## 2018-04-06 DIAGNOSIS — Z125 Encounter for screening for malignant neoplasm of prostate: Secondary | ICD-10-CM | POA: Diagnosis not present

## 2018-04-06 DIAGNOSIS — E7800 Pure hypercholesterolemia, unspecified: Secondary | ICD-10-CM | POA: Diagnosis not present

## 2018-04-10 DIAGNOSIS — Z0001 Encounter for general adult medical examination with abnormal findings: Secondary | ICD-10-CM | POA: Diagnosis not present

## 2018-04-10 DIAGNOSIS — I10 Essential (primary) hypertension: Secondary | ICD-10-CM | POA: Diagnosis not present

## 2018-04-10 DIAGNOSIS — M810 Age-related osteoporosis without current pathological fracture: Secondary | ICD-10-CM | POA: Diagnosis not present

## 2018-04-10 DIAGNOSIS — N3010 Interstitial cystitis (chronic) without hematuria: Secondary | ICD-10-CM | POA: Diagnosis not present

## 2018-04-10 DIAGNOSIS — Z1212 Encounter for screening for malignant neoplasm of rectum: Secondary | ICD-10-CM | POA: Diagnosis not present

## 2018-04-10 DIAGNOSIS — N401 Enlarged prostate with lower urinary tract symptoms: Secondary | ICD-10-CM | POA: Diagnosis not present

## 2018-04-10 DIAGNOSIS — L570 Actinic keratosis: Secondary | ICD-10-CM | POA: Diagnosis not present

## 2018-04-10 DIAGNOSIS — E291 Testicular hypofunction: Secondary | ICD-10-CM | POA: Diagnosis not present

## 2018-04-10 DIAGNOSIS — E7800 Pure hypercholesterolemia, unspecified: Secondary | ICD-10-CM | POA: Diagnosis not present

## 2018-04-10 DIAGNOSIS — Z1159 Encounter for screening for other viral diseases: Secondary | ICD-10-CM | POA: Diagnosis not present

## 2018-04-10 DIAGNOSIS — Z23 Encounter for immunization: Secondary | ICD-10-CM | POA: Diagnosis not present

## 2018-06-27 ENCOUNTER — Other Ambulatory Visit: Payer: Self-pay | Admitting: Dermatology

## 2018-06-27 DIAGNOSIS — L858 Other specified epidermal thickening: Secondary | ICD-10-CM | POA: Diagnosis not present

## 2018-06-27 DIAGNOSIS — D0439 Carcinoma in situ of skin of other parts of face: Secondary | ICD-10-CM | POA: Diagnosis not present

## 2018-06-27 DIAGNOSIS — C4442 Squamous cell carcinoma of skin of scalp and neck: Secondary | ICD-10-CM | POA: Diagnosis not present

## 2018-10-17 ENCOUNTER — Other Ambulatory Visit: Payer: Self-pay | Admitting: Dermatology

## 2018-10-17 DIAGNOSIS — D044 Carcinoma in situ of skin of scalp and neck: Secondary | ICD-10-CM | POA: Diagnosis not present

## 2018-10-17 DIAGNOSIS — D0439 Carcinoma in situ of skin of other parts of face: Secondary | ICD-10-CM | POA: Diagnosis not present

## 2018-11-29 ENCOUNTER — Other Ambulatory Visit: Payer: Self-pay

## 2019-01-02 DIAGNOSIS — R778 Other specified abnormalities of plasma proteins: Secondary | ICD-10-CM | POA: Diagnosis not present

## 2019-01-02 DIAGNOSIS — M810 Age-related osteoporosis without current pathological fracture: Secondary | ICD-10-CM | POA: Diagnosis not present

## 2019-01-18 DIAGNOSIS — Z23 Encounter for immunization: Secondary | ICD-10-CM | POA: Diagnosis not present

## 2019-02-27 DIAGNOSIS — L570 Actinic keratosis: Secondary | ICD-10-CM | POA: Diagnosis not present

## 2019-02-27 DIAGNOSIS — L821 Other seborrheic keratosis: Secondary | ICD-10-CM | POA: Diagnosis not present

## 2019-04-03 DIAGNOSIS — H02886 Meibomian gland dysfunction of left eye, unspecified eyelid: Secondary | ICD-10-CM | POA: Diagnosis not present

## 2019-04-03 DIAGNOSIS — H02883 Meibomian gland dysfunction of right eye, unspecified eyelid: Secondary | ICD-10-CM | POA: Diagnosis not present

## 2019-04-03 DIAGNOSIS — H2513 Age-related nuclear cataract, bilateral: Secondary | ICD-10-CM | POA: Diagnosis not present

## 2019-12-24 DIAGNOSIS — R351 Nocturia: Secondary | ICD-10-CM | POA: Diagnosis not present

## 2019-12-24 DIAGNOSIS — R972 Elevated prostate specific antigen [PSA]: Secondary | ICD-10-CM | POA: Diagnosis not present

## 2019-12-24 DIAGNOSIS — N401 Enlarged prostate with lower urinary tract symptoms: Secondary | ICD-10-CM | POA: Diagnosis not present

## 2020-01-15 ENCOUNTER — Other Ambulatory Visit: Payer: Self-pay | Admitting: Urology

## 2020-01-15 DIAGNOSIS — R972 Elevated prostate specific antigen [PSA]: Secondary | ICD-10-CM

## 2020-02-08 ENCOUNTER — Ambulatory Visit
Admission: RE | Admit: 2020-02-08 | Discharge: 2020-02-08 | Disposition: A | Discharge status: Home / Self Care | Payer: Medicare Other | Source: Ambulatory Visit | Attending: Urology | Admitting: Urology

## 2020-02-08 DIAGNOSIS — R972 Elevated prostate specific antigen [PSA]: Secondary | ICD-10-CM | POA: Diagnosis not present

## 2020-02-08 MED ORDER — GADOBENATE DIMEGLUMINE 529 MG/ML IV SOLN
14.0000 mL | Freq: Once | INTRAVENOUS | Status: AC | PRN
  Administered 2020-02-08: 14 mL via INTRAVENOUS

## 2020-02-14 DIAGNOSIS — Z23 Encounter for immunization: Secondary | ICD-10-CM | POA: Diagnosis not present

## 2020-02-27 DIAGNOSIS — R972 Elevated prostate specific antigen [PSA]: Secondary | ICD-10-CM | POA: Diagnosis not present

## 2020-02-27 DIAGNOSIS — R350 Frequency of micturition: Secondary | ICD-10-CM | POA: Diagnosis not present

## 2020-02-27 DIAGNOSIS — N401 Enlarged prostate with lower urinary tract symptoms: Secondary | ICD-10-CM | POA: Diagnosis not present

## 2020-03-02 DIAGNOSIS — Z23 Encounter for immunization: Secondary | ICD-10-CM | POA: Diagnosis not present

## 2020-04-17 DIAGNOSIS — R972 Elevated prostate specific antigen [PSA]: Secondary | ICD-10-CM | POA: Diagnosis not present

## 2020-04-17 DIAGNOSIS — Z125 Encounter for screening for malignant neoplasm of prostate: Secondary | ICD-10-CM | POA: Diagnosis not present

## 2020-04-17 DIAGNOSIS — I10 Essential (primary) hypertension: Secondary | ICD-10-CM | POA: Diagnosis not present

## 2020-04-17 DIAGNOSIS — M810 Age-related osteoporosis without current pathological fracture: Secondary | ICD-10-CM | POA: Diagnosis not present

## 2020-04-22 DIAGNOSIS — N400 Enlarged prostate without lower urinary tract symptoms: Secondary | ICD-10-CM | POA: Diagnosis not present

## 2020-04-22 DIAGNOSIS — M810 Age-related osteoporosis without current pathological fracture: Secondary | ICD-10-CM | POA: Diagnosis not present

## 2020-04-22 DIAGNOSIS — Z1212 Encounter for screening for malignant neoplasm of rectum: Secondary | ICD-10-CM | POA: Diagnosis not present

## 2020-04-22 DIAGNOSIS — E7800 Pure hypercholesterolemia, unspecified: Secondary | ICD-10-CM | POA: Diagnosis not present

## 2020-04-22 DIAGNOSIS — R972 Elevated prostate specific antigen [PSA]: Secondary | ICD-10-CM | POA: Diagnosis not present

## 2020-04-22 DIAGNOSIS — N3010 Interstitial cystitis (chronic) without hematuria: Secondary | ICD-10-CM | POA: Diagnosis not present

## 2020-04-22 DIAGNOSIS — I10 Essential (primary) hypertension: Secondary | ICD-10-CM | POA: Diagnosis not present

## 2020-04-22 DIAGNOSIS — Z0000 Encounter for general adult medical examination without abnormal findings: Secondary | ICD-10-CM | POA: Diagnosis not present

## 2020-04-22 DIAGNOSIS — D485 Neoplasm of uncertain behavior of skin: Secondary | ICD-10-CM | POA: Diagnosis not present

## 2020-04-22 DIAGNOSIS — K625 Hemorrhage of anus and rectum: Secondary | ICD-10-CM | POA: Diagnosis not present

## 2020-04-30 ENCOUNTER — Other Ambulatory Visit: Payer: Self-pay

## 2020-04-30 ENCOUNTER — Ambulatory Visit (INDEPENDENT_AMBULATORY_CARE_PROVIDER_SITE_OTHER): Payer: Medicare Other | Admitting: Dermatology

## 2020-04-30 ENCOUNTER — Encounter: Payer: Self-pay | Admitting: Dermatology

## 2020-04-30 DIAGNOSIS — D485 Neoplasm of uncertain behavior of skin: Secondary | ICD-10-CM

## 2020-04-30 DIAGNOSIS — L820 Inflamed seborrheic keratosis: Secondary | ICD-10-CM

## 2020-04-30 NOTE — Patient Instructions (Signed)

## 2020-05-04 DIAGNOSIS — Z1211 Encounter for screening for malignant neoplasm of colon: Secondary | ICD-10-CM | POA: Diagnosis not present

## 2020-05-04 DIAGNOSIS — Z1212 Encounter for screening for malignant neoplasm of rectum: Secondary | ICD-10-CM | POA: Diagnosis not present

## 2020-05-04 DIAGNOSIS — M810 Age-related osteoporosis without current pathological fracture: Secondary | ICD-10-CM | POA: Diagnosis not present

## 2020-05-04 DIAGNOSIS — M859 Disorder of bone density and structure, unspecified: Secondary | ICD-10-CM | POA: Diagnosis not present

## 2020-05-06 DIAGNOSIS — R778 Other specified abnormalities of plasma proteins: Secondary | ICD-10-CM | POA: Diagnosis not present

## 2020-05-06 DIAGNOSIS — M810 Age-related osteoporosis without current pathological fracture: Secondary | ICD-10-CM | POA: Diagnosis not present

## 2020-05-07 DIAGNOSIS — M810 Age-related osteoporosis without current pathological fracture: Secondary | ICD-10-CM | POA: Diagnosis not present

## 2020-05-13 LAB — COLOGUARD: COLOGUARD: NEGATIVE

## 2020-05-21 DIAGNOSIS — R778 Other specified abnormalities of plasma proteins: Secondary | ICD-10-CM | POA: Diagnosis not present

## 2020-05-21 DIAGNOSIS — M810 Age-related osteoporosis without current pathological fracture: Secondary | ICD-10-CM | POA: Diagnosis not present

## 2020-06-05 DIAGNOSIS — R972 Elevated prostate specific antigen [PSA]: Secondary | ICD-10-CM | POA: Diagnosis not present

## 2020-06-05 DIAGNOSIS — R351 Nocturia: Secondary | ICD-10-CM | POA: Diagnosis not present

## 2020-06-05 DIAGNOSIS — N401 Enlarged prostate with lower urinary tract symptoms: Secondary | ICD-10-CM | POA: Diagnosis not present

## 2020-07-08 ENCOUNTER — Ambulatory Visit (INDEPENDENT_AMBULATORY_CARE_PROVIDER_SITE_OTHER): Payer: Medicare Other | Admitting: Dermatology

## 2020-07-08 ENCOUNTER — Other Ambulatory Visit: Payer: Self-pay

## 2020-07-08 ENCOUNTER — Encounter: Payer: Self-pay | Admitting: Dermatology

## 2020-07-08 ENCOUNTER — Ambulatory Visit: Payer: Medicare Other | Admitting: Dermatology

## 2020-07-08 DIAGNOSIS — C4492 Squamous cell carcinoma of skin, unspecified: Secondary | ICD-10-CM

## 2020-07-08 DIAGNOSIS — D225 Melanocytic nevi of trunk: Secondary | ICD-10-CM | POA: Diagnosis not present

## 2020-07-08 DIAGNOSIS — C44529 Squamous cell carcinoma of skin of other part of trunk: Secondary | ICD-10-CM | POA: Diagnosis not present

## 2020-07-08 DIAGNOSIS — R202 Paresthesia of skin: Secondary | ICD-10-CM | POA: Diagnosis not present

## 2020-07-08 DIAGNOSIS — D229 Melanocytic nevi, unspecified: Secondary | ICD-10-CM

## 2020-07-08 DIAGNOSIS — D485 Neoplasm of uncertain behavior of skin: Secondary | ICD-10-CM

## 2020-07-08 DIAGNOSIS — L820 Inflamed seborrheic keratosis: Secondary | ICD-10-CM

## 2020-07-08 DIAGNOSIS — Z1283 Encounter for screening for malignant neoplasm of skin: Secondary | ICD-10-CM | POA: Diagnosis not present

## 2020-07-08 DIAGNOSIS — C44329 Squamous cell carcinoma of skin of other parts of face: Secondary | ICD-10-CM

## 2020-07-08 DIAGNOSIS — L570 Actinic keratosis: Secondary | ICD-10-CM

## 2020-07-08 HISTORY — DX: Squamous cell carcinoma of skin, unspecified: C44.92

## 2020-07-08 HISTORY — DX: Melanocytic nevi, unspecified: D22.9

## 2020-07-08 NOTE — Patient Instructions (Signed)

## 2020-07-13 DIAGNOSIS — H35033 Hypertensive retinopathy, bilateral: Secondary | ICD-10-CM | POA: Diagnosis not present

## 2020-07-15 ENCOUNTER — Telehealth: Payer: Self-pay | Admitting: *Deleted

## 2020-07-15 ENCOUNTER — Encounter: Payer: Self-pay | Admitting: *Deleted

## 2020-07-15 NOTE — Telephone Encounter (Signed)
Pathology to patient-surgery appointment scheduled.  

## 2020-07-15 NOTE — Telephone Encounter (Signed)
-----   Message from Lavonna Monarch, MD sent at 07/14/2020 10:09 PM EDT ----- Will need the skin cancer treated as well as wider shave excision of the severely atypical mole.

## 2020-07-16 DIAGNOSIS — C441192 Basal cell carcinoma of skin of left lower eyelid, including canthus: Secondary | ICD-10-CM | POA: Diagnosis not present

## 2020-07-16 DIAGNOSIS — Z961 Presence of intraocular lens: Secondary | ICD-10-CM | POA: Diagnosis not present

## 2020-07-16 DIAGNOSIS — H02524 Blepharophimosis left upper eyelid: Secondary | ICD-10-CM | POA: Diagnosis not present

## 2020-07-27 ENCOUNTER — Encounter: Payer: Self-pay | Admitting: Dermatology

## 2020-07-27 NOTE — Progress Notes (Addendum)
Follow-Up Visit   Subjective  David Fuentes is a 75 y.o. male who presents for the following: Follow-up (RIGHT MID BACK ITCH AND TENDER TO TOUCH, LEFT SIDEBURN BURNS WHEN HE SHAVES).  General skin examination with multiple spots to check. Location:  Duration:  Quality:  Associated Signs/Symptoms: Modifying Factors:  Severity:  Timing: Context: History of skin cancers and atypical mole.  Objective  Well appearing patient in no apparent distress; mood and affect are within normal limits. Objective  Left Mid Back: Gray-brown 6 mm macule with dermoscopic atypia       Objective  Left Parotid Area: 6 mm waxy pink crust, rule out SCCA       Objective  Right Temple, Right Temporal Scalp: Four millimeter hornlike crusts  Objective  Right Lower Back: Inflamed pink flattopped crust  Objective  Waist Up: No recurrent skin cancer.  Possible atypical mole and new nonmobile skin cancers biopsied.  Objective  Right Upper Back: Area with historical recurrent itching burning.  If treating the nearby inflamed keratosis is not helpful: David Fuentes can look for nonprescription itch lotion containing the ingredient pramoxine and apply this as frequently as needed if it produces relief of his skin symptoms.   All skin waist up examined.   Assessment & Plan    Neoplasm of uncertain behavior of skin (2) Left Mid Back  Skin / nail biopsy Type of biopsy: tangential   Informed consent: discussed and consent obtained   Timeout: patient name, date of birth, surgical site, and procedure verified   Procedure prep:  Patient was prepped and draped in usual sterile fashion (Non sterile) Prep type:  Chlorhexidine Anesthesia: the lesion was anesthetized in a standard fashion   Anesthetic:  1% lidocaine w/ epinephrine 1-100,000 local infiltration Instrument used: flexible razor blade   Hemostasis achieved with: ferric subsulfate   Outcome: patient tolerated procedure well    Post-procedure details: sterile dressing applied and wound care instructions given   Dressing type: petrolatum and bandage    Specimen 1 - Surgical pathology Differential Diagnosis: R/O Atypia  Check Margins: No  Left Parotid Area  Skin / nail biopsy Type of biopsy: tangential   Informed consent: discussed and consent obtained   Timeout: patient name, date of birth, surgical site, and procedure verified   Procedure prep:  Patient was prepped and draped in usual sterile fashion (Non sterile) Prep type:  Chlorhexidine Anesthesia: the lesion was anesthetized in a standard fashion   Anesthetic:  1% lidocaine w/ epinephrine 1-100,000 local infiltration Instrument used: flexible razor blade   Hemostasis achieved with: ferric subsulfate   Outcome: patient tolerated procedure well   Post-procedure details: sterile dressing applied and wound care instructions given   Dressing type: bandage and petrolatum    Specimen 2 - Surgical pathology Differential Diagnosis: R/O BCC vs SCC  Check Margins: No  AK (actinic keratosis) (2) Right Temple; Right Temporal Scalp  Destruction of lesion - Right Temple, Right Temporal Scalp Complexity: simple   Destruction method: cryotherapy   Informed consent: discussed and consent obtained   Timeout:  patient name, date of birth, surgical site, and procedure verified Lesion destroyed using liquid nitrogen: Yes   Cryotherapy cycles:  5 Outcome: patient tolerated procedure well with no complications   Post-procedure details: wound care instructions given    Inflamed seborrheic keratosis Right Lower Back  Destruction of lesion - Right Lower Back Complexity: simple   Destruction method: cryotherapy   Informed consent: discussed and consent obtained  Timeout:  patient name, date of birth, surgical site, and procedure verified Lesion destroyed using liquid nitrogen: Yes   Cryotherapy cycles:  5 Outcome: patient tolerated procedure well with no  complications   Post-procedure details: wound care instructions given    Skin exam for malignant neoplasm Waist Up  Yearly skin check  Notalgia paresthetica Right Upper Back  Topical pramoxine.     I, Lavonna Monarch, MD, have reviewed all documentation for this visit.  The documentation on 08/13/20 for the exam, diagnosis, procedures, and orders are all accurate and complete.

## 2020-08-19 ENCOUNTER — Other Ambulatory Visit: Payer: Self-pay

## 2020-08-19 ENCOUNTER — Ambulatory Visit (INDEPENDENT_AMBULATORY_CARE_PROVIDER_SITE_OTHER): Payer: Medicare Other | Admitting: Dermatology

## 2020-08-19 ENCOUNTER — Encounter: Payer: Self-pay | Admitting: Dermatology

## 2020-08-19 DIAGNOSIS — D239 Other benign neoplasm of skin, unspecified: Secondary | ICD-10-CM

## 2020-08-19 DIAGNOSIS — C44329 Squamous cell carcinoma of skin of other parts of face: Secondary | ICD-10-CM | POA: Diagnosis not present

## 2020-08-19 DIAGNOSIS — L988 Other specified disorders of the skin and subcutaneous tissue: Secondary | ICD-10-CM | POA: Diagnosis not present

## 2020-08-19 DIAGNOSIS — D235 Other benign neoplasm of skin of trunk: Secondary | ICD-10-CM

## 2020-08-19 DIAGNOSIS — D099 Carcinoma in situ, unspecified: Secondary | ICD-10-CM

## 2020-08-19 DIAGNOSIS — D229 Melanocytic nevi, unspecified: Secondary | ICD-10-CM

## 2020-08-19 NOTE — Patient Instructions (Signed)

## 2020-08-30 ENCOUNTER — Encounter: Payer: Self-pay | Admitting: Dermatology

## 2020-08-30 NOTE — Progress Notes (Signed)
   Follow-Up Visit   Subjective  David Fuentes is a 75 y.o. male who presents for the following: Procedure (Scc x1 w/s x 1 ).  Skin cancer face, severely dysplastic mole back Location:  Duration:  Quality:  Associated Signs/Symptoms: Modifying Factors:  Severity:  Timing: Context: Treatment  Objective  Well appearing patient in no apparent distress; mood and affect are within normal limits. Objective  Left Parotid Area: Lesion identified by Dr.Gerson Fauth and nurse in room.    Objective  Left Upper Back: Biopsy site identified by nurse and me.    All skin waist up examined.   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 Parotid Area  Destruction of lesion Complexity: simple   Destruction method: electrodesiccation and curettage   Informed consent: discussed and consent obtained   Timeout:  patient name, date of birth, surgical site, and procedure verified Anesthesia: the lesion was anesthetized in a standard fashion   Anesthetic:  1% lidocaine w/ epinephrine 1-100,000 local infiltration Curettage performed in three different directions: Yes   Electrodesiccation performed over the curetted area: Yes   Curettage cycles:  3 Lesion length (cm):  1.3 Lesion width (cm):  1.3 Margin per side (cm):  0 Final wound size (cm):  1.3 Hemostasis achieved with:  ferric subsulfate Outcome: patient tolerated procedure well with no complications   Additional details:  Wound innoculated with 5 fluorouracil solution.  Dysplastic nevi Left Upper Back  Epidermal / dermal shaving - Left Upper Back  Lesion diameter (cm):  1.2 Informed consent: discussed and consent obtained   Timeout: patient name, date of birth, surgical site, and procedure verified   Procedure prep:  Patient was prepped and draped in usual sterile fashion Prep type:  Chlorhexidine Anesthesia: the lesion was anesthetized in a standard fashion   Anesthetic:  1% lidocaine w/  epinephrine 1-100,000 local infiltration Instrument used: flexible razor blade   Hemostasis achieved with: ferric subsulfate   Outcome: patient tolerated procedure well   Post-procedure details: sterile dressing applied and wound care instructions given   Dressing type: petrolatum        I, Lavonna Monarch, MD, have reviewed all documentation for this visit.  The documentation on 08/30/20 for the exam, diagnosis, procedures, and orders are all accurate and complete.

## 2020-09-03 ENCOUNTER — Encounter: Payer: Medicare Other | Admitting: Dermatology

## 2020-10-29 IMAGING — MR MR PROSTATE WO/W CM
12 series · 48 of 48 positions shown · IV contrast (multihance)
Comparison: None.

CLINICAL DATA: Elevated PSA.

EXAM:
MR PROSTATE WITHOUT AND WITH CONTRAST
TECHNIQUE: Multiplanar multisequence MRI images were obtained of the pelvis
centered about the prostate. Pre and post contrast images were
obtained.
CONTRAST:  14mL MULTIHANCE GADOBENATE DIMEGLUMINE 529 MG/ML IV SOLN

[Series 4: T2 · coronal · 3.0mm · 0.56mm/px · 1 of 23 slices shown (1 of 3)]
[im 1/23]
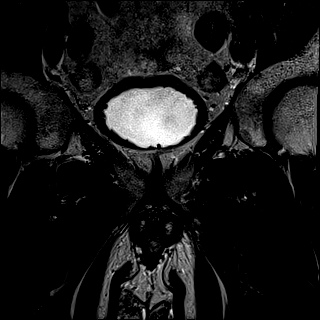

[Series 5: T1 · axial · 5.0mm · 1.25mm/px · z∈[-81,+114]mm · 2 of 80 slices shown]
[im 1/80]
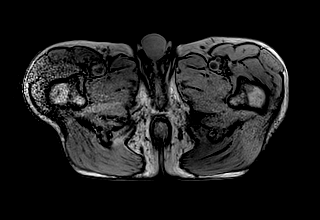
[im 80/80]
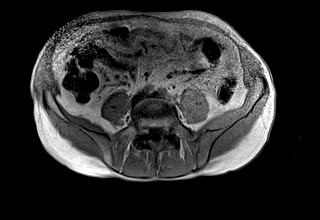

[Series 6: DWI · axial · 3.0mm · 1.75mm/px · z∈[-50,+7]mm · 2 of 60 slices shown (1 of 3)]
[im 1/60]
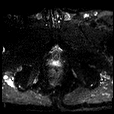
[im 60/60]
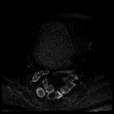

[Series 7: DWI · axial · 3.0mm · 1.75mm/px · 1 of 20 slices shown (2 of 3)]
[im 1/20]
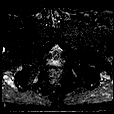

[Series 8: DWI · axial · 3.0mm · 1.75mm/px · 1 of 20 slices shown (3 of 3)]
[im 1/20]
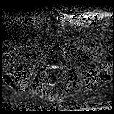

[Series 9: T2 · axial · 3.0mm · 0.56mm/px · 1 of 23 slices shown (2 of 3)]
[im 1/23]
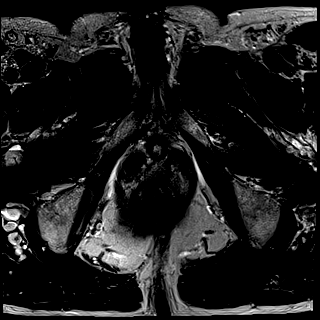

[Series 10: T2 · axial · 1.0mm · 1.04mm/px · z∈[-61,+18]mm · 2 of 80 slices shown (3 of 3)]
[im 1/80]
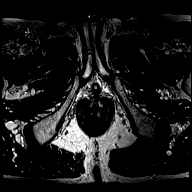
[im 80/80]
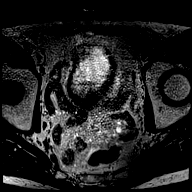

[Series 11: pre t1_twist_tra_dyn · axial · non-contrast · 3.5mm · 0.83mm/px · 1 of 18 slices shown]
[im 1/18]
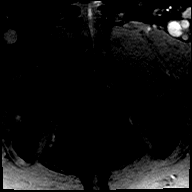

[Series 12: post t1_twist_tra_dyn-copy center · axial · non-contrast · 3.5mm · 0.83mm/px · z∈[-51,+8]mm · 17 of 540 slices shown]
[im 1/540]
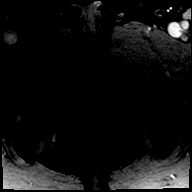
[im 34/540]
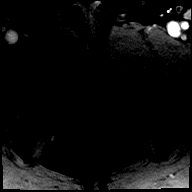
[im 68/540]
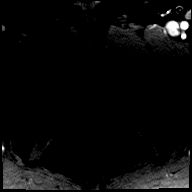
[im 102/540]
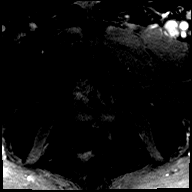
[im 135/540]
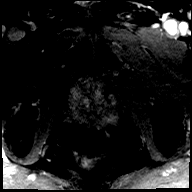
[im 169/540]
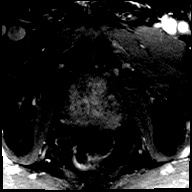
[im 203/540]
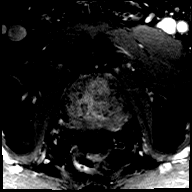
[im 236/540]
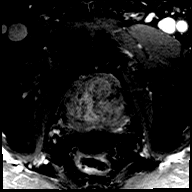
[im 270/540]
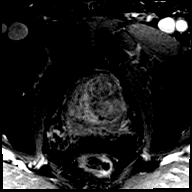
[im 304/540]
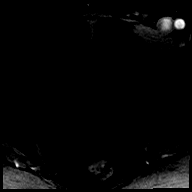
[im 337/540]
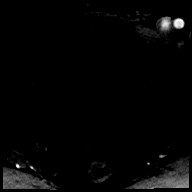
[im 371/540]
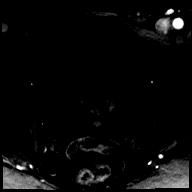
[im 405/540]
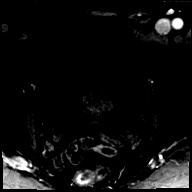
[im 438/540]
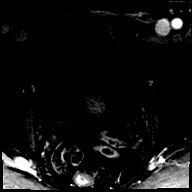
[im 472/540]
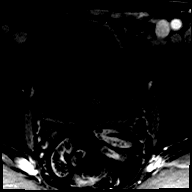
[im 506/540]
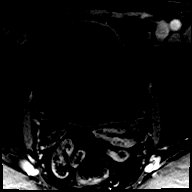
[im 540/540]
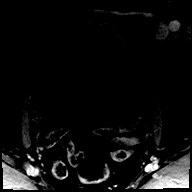

[Series 13: post t1_twist_tra_dyn-copy cent_sub · axial · 3.5mm · 0.83mm/px · z∈[-51,+8]mm · 16 of 522 slices shown]
[im 1/522]
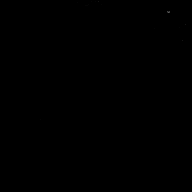
[im 35/522]
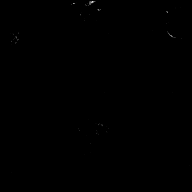
[im 70/522]
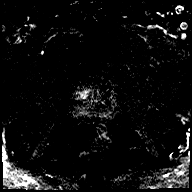
[im 105/522]
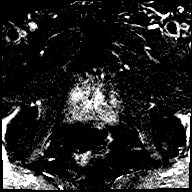
[im 139/522]
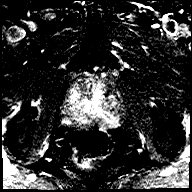
[im 174/522]
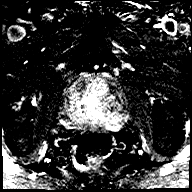
[im 209/522]
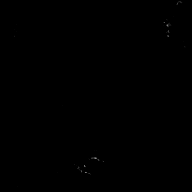
[im 244/522]
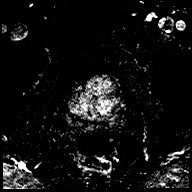
[im 278/522]
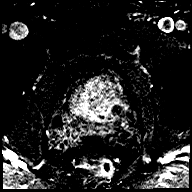
[im 313/522]
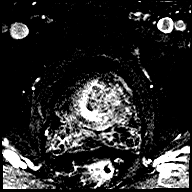
[im 348/522]
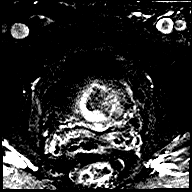
[im 383/522]
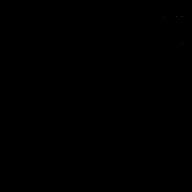
[im 417/522]
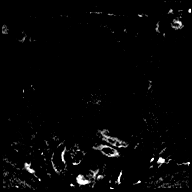
[im 452/522]
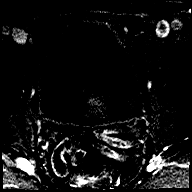
[im 487/522]
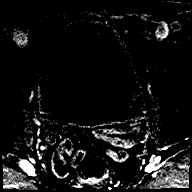
[im 522/522]
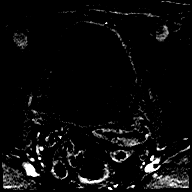

[Series 14: t1_vibe_dixon_tra_f · axial · 2.5mm · 0.91mm/px · z∈[-83,+115]mm · 2 of 80 slices shown]
[im 1/80]
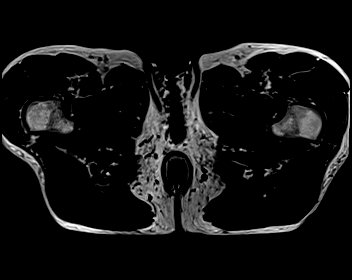
[im 80/80]
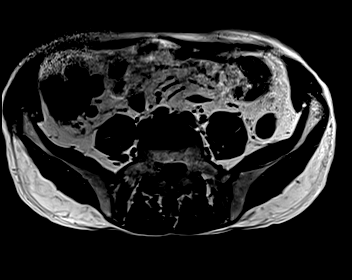

[Series 15: t1_vibe_dixon_tra_w · axial · 2.5mm · 0.91mm/px · z∈[-83,+115]mm · 2 of 80 slices shown]
[im 1/80]
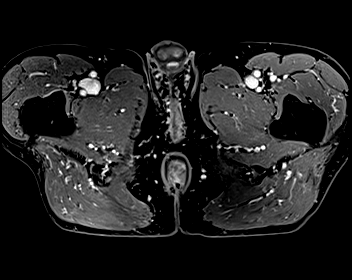
[im 80/80]
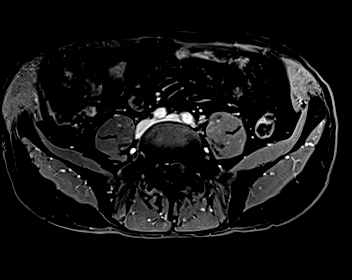

[48 of 48 positions shown; findings below may reference images not displayed]

FINDINGS: Prostate:

-- Peripheral Zone: No abnormality seen on ADC and high b-value DWI
sequences.

-- Transition/Central Zone: Circumscribed BPH nodules are noted, but
no suspicious nodules with obscured or non-circumscribed margins
seen.

-- Measurements/Volume:  5.6 x 4.7 x 5.9 cm (volume = 81 cm^3)

Transcapsular spread:  Absent

Seminal vesicle involvement:  Absent

Neurovascular bundle involvement:  Absent

Pelvic adenopathy: None visualized

Bone metastasis: None visualized

Other: Diffuse bladder wall thickening and trabeculation, consistent
chronic bladder outlet obstruction.
IMPRESSION: No radiographic evidence of high-grade prostate carcinoma. PI-RADS
1: Very Low (clinically significant cancer is highly unlikely to be
present)

## 2020-11-19 DIAGNOSIS — M810 Age-related osteoporosis without current pathological fracture: Secondary | ICD-10-CM | POA: Diagnosis not present

## 2020-12-01 ENCOUNTER — Encounter: Payer: Self-pay | Admitting: Dermatology

## 2020-12-01 NOTE — Progress Notes (Signed)
   Follow-Up Visit   Subjective  David Fuentes is a 75 y.o. male who presents for the following: Skin Problem (Patient here today for place on right thigh x few weeks. Per patient it came up as a red spot that was itching now it's getting worse.). New growth on right thigh Location:  Duration:  Quality:  Associated Signs/Symptoms: Modifying Factors:  Severity:  Timing: Context:   Objective  Well appearing patient in no apparent distress; mood and affect are within normal limits. Right Thigh - Anterior 50m pink crust with central crater, rule out KA.         A focused examination was performed including leg. Relevant physical exam findings are noted in the Assessment and Plan.   Assessment & Plan    Neoplasm of uncertain behavior of skin Right Thigh - Anterior  Skin / nail biopsy Type of biopsy: tangential   Informed consent: discussed and consent obtained   Timeout: patient name, date of birth, surgical site, and procedure verified   Procedure prep:  Patient was prepped and draped in usual sterile fashion (Non sterile) Prep type:  Chlorhexidine Anesthesia: the lesion was anesthetized in a standard fashion   Anesthetic:  1% lidocaine w/ epinephrine 1-100,000 local infiltration Instrument used: flexible razor blade   Outcome: patient tolerated procedure well   Post-procedure details: sterile dressing applied and wound care instructions given   Dressing type: bandage and petrolatum    Destruction of lesion Complexity: simple   Destruction method: electrodesiccation and curettage   Informed consent: discussed and consent obtained   Timeout:  patient name, date of birth, surgical site, and procedure verified Anesthesia: the lesion was anesthetized in a standard fashion   Anesthetic:  1% lidocaine w/ epinephrine 1-100,000 local infiltration Curettage performed in three different directions: Yes   Electrodesiccation performed over the curetted area: Yes   Curettage  cycles:  1 Margin per side (cm):  0.1 Final wound size (cm):  0.8 Hemostasis achieved with:  ferric subsulfate and electrodesiccation Outcome: patient tolerated procedure well with no complications   Post-procedure details: wound care instructions given    Specimen 1 - Surgical pathology Differential Diagnosis: R/O BCC vs SCC  Check Margins: No  Treatment after biopsy  After shave biopsy the base of the lesion was treated with curettage plus cautery      I, SLavonna Monarch MD, have reviewed all documentation for this visit.  The documentation on 12/01/20 for the exam, diagnosis, procedures, and orders are all accurate and complete.

## 2020-12-04 DIAGNOSIS — N401 Enlarged prostate with lower urinary tract symptoms: Secondary | ICD-10-CM | POA: Diagnosis not present

## 2020-12-04 DIAGNOSIS — R350 Frequency of micturition: Secondary | ICD-10-CM | POA: Diagnosis not present

## 2020-12-04 DIAGNOSIS — R972 Elevated prostate specific antigen [PSA]: Secondary | ICD-10-CM | POA: Diagnosis not present

## 2021-01-13 ENCOUNTER — Ambulatory Visit (INDEPENDENT_AMBULATORY_CARE_PROVIDER_SITE_OTHER): Payer: Medicare Other | Admitting: Dermatology

## 2021-01-13 ENCOUNTER — Other Ambulatory Visit: Payer: Self-pay

## 2021-01-13 DIAGNOSIS — D485 Neoplasm of uncertain behavior of skin: Secondary | ICD-10-CM

## 2021-01-13 DIAGNOSIS — L720 Epidermal cyst: Secondary | ICD-10-CM | POA: Diagnosis not present

## 2021-01-13 DIAGNOSIS — D0421 Carcinoma in situ of skin of right ear and external auricular canal: Secondary | ICD-10-CM | POA: Diagnosis not present

## 2021-01-13 DIAGNOSIS — Z85828 Personal history of other malignant neoplasm of skin: Secondary | ICD-10-CM

## 2021-01-13 DIAGNOSIS — Z1283 Encounter for screening for malignant neoplasm of skin: Secondary | ICD-10-CM | POA: Diagnosis not present

## 2021-01-13 DIAGNOSIS — D0439 Carcinoma in situ of skin of other parts of face: Secondary | ICD-10-CM | POA: Diagnosis not present

## 2021-01-13 DIAGNOSIS — L820 Inflamed seborrheic keratosis: Secondary | ICD-10-CM | POA: Diagnosis not present

## 2021-01-13 DIAGNOSIS — L570 Actinic keratosis: Secondary | ICD-10-CM | POA: Diagnosis not present

## 2021-01-13 DIAGNOSIS — C4492 Squamous cell carcinoma of skin, unspecified: Secondary | ICD-10-CM

## 2021-01-13 HISTORY — DX: Squamous cell carcinoma of skin, unspecified: C44.92

## 2021-01-13 NOTE — Patient Instructions (Signed)

## 2021-01-20 ENCOUNTER — Telehealth: Payer: Self-pay | Admitting: *Deleted

## 2021-01-20 NOTE — Telephone Encounter (Signed)
Path to patient. Made surgery appointment with Dr.Tafeen.  

## 2021-01-20 NOTE — Telephone Encounter (Signed)
-----   Message from Lavonna Monarch, MD sent at 01/20/2021  7:08 AM EDT ----- Schedule surgery with Dr. Darene Lamer

## 2021-01-24 ENCOUNTER — Encounter: Payer: Self-pay | Admitting: Dermatology

## 2021-01-24 NOTE — Progress Notes (Signed)
   Follow-Up Visit   Subjective  David Fuentes is a 75 y.o. male who presents for the following: Follow-up (Here for 6 month check up. Concerns left arm, right side face and back. Patient has a history of non mole skin cancers. ).  Changing spots on right cheek and upper back, check other areas Location:  Duration:  Quality:  Associated Signs/Symptoms: Modifying Factors:  Severity:  Timing: Context:   Objective  Well appearing patient in no apparent distress; mood and affect are within normal limits. Left Parietal Scalp, Left Zygomatic Area Gritty 5 mm pink crusts  Right Upper Back Bi-chromic 8 mm no longer subtly raised lesion; dermoscopy favors irritated and keratosis       Right Preauricular Area Waxy focally eroded pink papule, superficial carcinoma on       Mid Back (2) 3 cm noninflamed deep dermal to subcutaneous nodule in with central dell General: Epidermoid cyst  Mid Back Waist up skin exam.  Possible skin cancers right upper back right back cheek will be biopsied.    All skin waist up examined.   Assessment & Plan    AK (actinic keratosis) (2) Left Parietal Scalp; Left Zygomatic Area  Destruction of lesion - Left Parietal Scalp, Left Zygomatic Area Complexity: simple   Destruction method: cryotherapy   Informed consent: discussed and consent obtained   Timeout:  patient name, date of birth, surgical site, and procedure verified Lesion destroyed using liquid nitrogen: Yes   Cryotherapy cycles:  3 Outcome: patient tolerated procedure well with no complications   Post-procedure details: wound care instructions given    Neoplasm of uncertain behavior of skin (2) Right Upper Back  Skin / nail biopsy Type of biopsy: tangential   Informed consent: discussed and consent obtained   Timeout: patient name, date of birth, surgical site, and procedure verified   Anesthesia: the lesion was anesthetized in a standard fashion   Anesthetic:  1%  lidocaine w/ epinephrine 1-100,000 local infiltration Instrument used: flexible razor blade   Hemostasis achieved with: ferric subsulfate   Outcome: patient tolerated procedure well   Post-procedure details: wound care instructions given    Specimen 1 - Surgical pathology Differential Diagnosis: atypia, SK  Check Margins: No  Right Preauricular Area  Skin / nail biopsy Type of biopsy: tangential   Informed consent: discussed and consent obtained   Timeout: patient name, date of birth, surgical site, and procedure verified   Anesthesia: the lesion was anesthetized in a standard fashion   Anesthetic:  1% lidocaine w/ epinephrine 1-100,000 local infiltration Instrument used: flexible razor blade   Hemostasis achieved with: ferric subsulfate   Outcome: patient tolerated procedure well   Post-procedure details: wound care instructions given    Specimen 2 - Surgical pathology Differential Diagnosis: SCC vs BCC  Check Margins: No  Epidermal cyst (2) Mid Back  Benign no treatment unless bothers patient or clinically changes  Screening for malignant neoplasm of skin Mid Back  Yearly skin exam.      I, Lavonna Monarch, MD, have reviewed all documentation for this visit.  The documentation on 01/24/21 for the exam, diagnosis, procedures, and orders are all accurate and complete.

## 2021-02-05 DIAGNOSIS — Z23 Encounter for immunization: Secondary | ICD-10-CM | POA: Diagnosis not present

## 2021-02-25 ENCOUNTER — Ambulatory Visit (INDEPENDENT_AMBULATORY_CARE_PROVIDER_SITE_OTHER): Payer: Medicare Other | Admitting: Dermatology

## 2021-02-25 ENCOUNTER — Encounter: Payer: Self-pay | Admitting: Dermatology

## 2021-02-25 ENCOUNTER — Other Ambulatory Visit: Payer: Self-pay

## 2021-02-25 DIAGNOSIS — C44320 Squamous cell carcinoma of skin of unspecified parts of face: Secondary | ICD-10-CM

## 2021-02-25 NOTE — Patient Instructions (Signed)

## 2021-03-15 ENCOUNTER — Encounter: Payer: Self-pay | Admitting: Dermatology

## 2021-03-15 NOTE — Progress Notes (Signed)
   Follow-Up Visit   Subjective  David Fuentes is a 75 y.o. male who presents for the following: Procedure (Here to treat right preauricular CIS. ).  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 Right Preauricular Area Biopsy site identified by nurse and me    A focused examination was performed including head and neck. Relevant physical exam findings are noted in the Assessment and Plan.   Assessment & Plan    SCC (squamous cell carcinoma), face Right Preauricular Area  Destruction of lesion Complexity: simple   Destruction method: electrodesiccation and curettage   Informed consent: discussed and consent obtained   Timeout:  patient name, date of birth, surgical site, and procedure verified Anesthesia: the lesion was anesthetized in a standard fashion   Anesthetic:  1% lidocaine w/ epinephrine 1-100,000 local infiltration Curettage performed in three different directions: Yes   Curettage cycles:  3 Lesion length (cm):  1.1 Lesion width (cm):  1.1 Margin per side (cm):  0 Final wound size (cm):  1.1 Hemostasis achieved with:  ferric subsulfate Outcome: patient tolerated procedure well with no complications   Additional details:  Wound innoculated with 5 fluorouracil solution.      I, Lavonna Monarch, MD, have reviewed all documentation for this visit.  The documentation on 03/15/21 for the exam, diagnosis, procedures, and orders are all accurate and complete.

## 2021-03-18 ENCOUNTER — Encounter: Payer: Medicare Other | Admitting: Dermatology

## 2021-04-23 DIAGNOSIS — M810 Age-related osteoporosis without current pathological fracture: Secondary | ICD-10-CM | POA: Diagnosis not present

## 2021-04-23 DIAGNOSIS — I10 Essential (primary) hypertension: Secondary | ICD-10-CM | POA: Diagnosis not present

## 2021-04-23 DIAGNOSIS — E7801 Familial hypercholesterolemia: Secondary | ICD-10-CM | POA: Diagnosis not present

## 2021-04-23 DIAGNOSIS — Z125 Encounter for screening for malignant neoplasm of prostate: Secondary | ICD-10-CM | POA: Diagnosis not present

## 2021-04-27 DIAGNOSIS — Z1212 Encounter for screening for malignant neoplasm of rectum: Secondary | ICD-10-CM | POA: Diagnosis not present

## 2021-04-27 DIAGNOSIS — R972 Elevated prostate specific antigen [PSA]: Secondary | ICD-10-CM | POA: Diagnosis not present

## 2021-04-27 DIAGNOSIS — M810 Age-related osteoporosis without current pathological fracture: Secondary | ICD-10-CM | POA: Diagnosis not present

## 2021-04-27 DIAGNOSIS — I10 Essential (primary) hypertension: Secondary | ICD-10-CM | POA: Diagnosis not present

## 2021-04-27 DIAGNOSIS — N3010 Interstitial cystitis (chronic) without hematuria: Secondary | ICD-10-CM | POA: Diagnosis not present

## 2021-04-27 DIAGNOSIS — Z0000 Encounter for general adult medical examination without abnormal findings: Secondary | ICD-10-CM | POA: Diagnosis not present

## 2021-05-24 DIAGNOSIS — R778 Other specified abnormalities of plasma proteins: Secondary | ICD-10-CM | POA: Diagnosis not present

## 2021-05-24 DIAGNOSIS — E559 Vitamin D deficiency, unspecified: Secondary | ICD-10-CM | POA: Diagnosis not present

## 2021-05-24 DIAGNOSIS — I10 Essential (primary) hypertension: Secondary | ICD-10-CM | POA: Diagnosis not present

## 2021-05-24 DIAGNOSIS — M810 Age-related osteoporosis without current pathological fracture: Secondary | ICD-10-CM | POA: Diagnosis not present

## 2021-05-24 DIAGNOSIS — Z7982 Long term (current) use of aspirin: Secondary | ICD-10-CM | POA: Diagnosis not present

## 2021-05-25 DIAGNOSIS — I10 Essential (primary) hypertension: Secondary | ICD-10-CM | POA: Diagnosis not present

## 2021-05-27 DIAGNOSIS — R972 Elevated prostate specific antigen [PSA]: Secondary | ICD-10-CM | POA: Diagnosis not present

## 2021-06-02 ENCOUNTER — Ambulatory Visit: Payer: Medicare Other | Admitting: Dermatology

## 2021-06-03 DIAGNOSIS — R972 Elevated prostate specific antigen [PSA]: Secondary | ICD-10-CM | POA: Diagnosis not present

## 2021-06-03 DIAGNOSIS — R350 Frequency of micturition: Secondary | ICD-10-CM | POA: Diagnosis not present

## 2021-06-03 DIAGNOSIS — N401 Enlarged prostate with lower urinary tract symptoms: Secondary | ICD-10-CM | POA: Diagnosis not present

## 2021-06-08 DIAGNOSIS — I10 Essential (primary) hypertension: Secondary | ICD-10-CM | POA: Diagnosis not present

## 2021-11-04 DIAGNOSIS — R972 Elevated prostate specific antigen [PSA]: Secondary | ICD-10-CM | POA: Diagnosis not present

## 2021-11-11 DIAGNOSIS — N401 Enlarged prostate with lower urinary tract symptoms: Secondary | ICD-10-CM | POA: Diagnosis not present

## 2021-11-11 DIAGNOSIS — R972 Elevated prostate specific antigen [PSA]: Secondary | ICD-10-CM | POA: Diagnosis not present

## 2021-11-11 DIAGNOSIS — R350 Frequency of micturition: Secondary | ICD-10-CM | POA: Diagnosis not present

## 2021-11-22 DIAGNOSIS — Z7982 Long term (current) use of aspirin: Secondary | ICD-10-CM | POA: Diagnosis not present

## 2021-11-22 DIAGNOSIS — E559 Vitamin D deficiency, unspecified: Secondary | ICD-10-CM | POA: Diagnosis not present

## 2021-11-22 DIAGNOSIS — I10 Essential (primary) hypertension: Secondary | ICD-10-CM | POA: Diagnosis not present

## 2021-11-22 DIAGNOSIS — M810 Age-related osteoporosis without current pathological fracture: Secondary | ICD-10-CM | POA: Diagnosis not present

## 2021-11-22 DIAGNOSIS — R778 Other specified abnormalities of plasma proteins: Secondary | ICD-10-CM | POA: Diagnosis not present

## 2021-11-22 DIAGNOSIS — Z79899 Other long term (current) drug therapy: Secondary | ICD-10-CM | POA: Diagnosis not present

## 2022-01-18 ENCOUNTER — Ambulatory Visit: Payer: Medicare Other | Admitting: Dermatology

## 2022-02-07 DIAGNOSIS — H35033 Hypertensive retinopathy, bilateral: Secondary | ICD-10-CM | POA: Diagnosis not present

## 2022-02-11 DIAGNOSIS — Z23 Encounter for immunization: Secondary | ICD-10-CM | POA: Diagnosis not present

## 2022-02-21 DIAGNOSIS — L729 Follicular cyst of the skin and subcutaneous tissue, unspecified: Secondary | ICD-10-CM | POA: Diagnosis not present

## 2022-02-25 DIAGNOSIS — L729 Follicular cyst of the skin and subcutaneous tissue, unspecified: Secondary | ICD-10-CM | POA: Diagnosis not present

## 2022-03-17 DIAGNOSIS — L720 Epidermal cyst: Secondary | ICD-10-CM | POA: Diagnosis not present

## 2022-04-29 DIAGNOSIS — Z125 Encounter for screening for malignant neoplasm of prostate: Secondary | ICD-10-CM | POA: Diagnosis not present

## 2022-04-29 DIAGNOSIS — I10 Essential (primary) hypertension: Secondary | ICD-10-CM | POA: Diagnosis not present

## 2022-04-29 DIAGNOSIS — M810 Age-related osteoporosis without current pathological fracture: Secondary | ICD-10-CM | POA: Diagnosis not present

## 2022-05-04 DIAGNOSIS — Z23 Encounter for immunization: Secondary | ICD-10-CM | POA: Diagnosis not present

## 2022-05-04 DIAGNOSIS — E7800 Pure hypercholesterolemia, unspecified: Secondary | ICD-10-CM | POA: Diagnosis not present

## 2022-05-04 DIAGNOSIS — N3010 Interstitial cystitis (chronic) without hematuria: Secondary | ICD-10-CM | POA: Diagnosis not present

## 2022-05-04 DIAGNOSIS — I10 Essential (primary) hypertension: Secondary | ICD-10-CM | POA: Diagnosis not present

## 2022-05-04 DIAGNOSIS — R972 Elevated prostate specific antigen [PSA]: Secondary | ICD-10-CM | POA: Diagnosis not present

## 2022-05-04 DIAGNOSIS — M810 Age-related osteoporosis without current pathological fracture: Secondary | ICD-10-CM | POA: Diagnosis not present

## 2022-05-04 DIAGNOSIS — I8393 Asymptomatic varicose veins of bilateral lower extremities: Secondary | ICD-10-CM | POA: Diagnosis not present

## 2022-05-04 DIAGNOSIS — Z0000 Encounter for general adult medical examination without abnormal findings: Secondary | ICD-10-CM | POA: Diagnosis not present

## 2022-05-19 DIAGNOSIS — D225 Melanocytic nevi of trunk: Secondary | ICD-10-CM | POA: Diagnosis not present

## 2022-05-19 DIAGNOSIS — C44529 Squamous cell carcinoma of skin of other part of trunk: Secondary | ICD-10-CM | POA: Diagnosis not present

## 2022-05-19 DIAGNOSIS — D485 Neoplasm of uncertain behavior of skin: Secondary | ICD-10-CM | POA: Diagnosis not present

## 2022-05-20 DIAGNOSIS — M810 Age-related osteoporosis without current pathological fracture: Secondary | ICD-10-CM | POA: Diagnosis not present

## 2022-05-30 DIAGNOSIS — Z79899 Other long term (current) drug therapy: Secondary | ICD-10-CM | POA: Diagnosis not present

## 2022-05-30 DIAGNOSIS — E559 Vitamin D deficiency, unspecified: Secondary | ICD-10-CM | POA: Diagnosis not present

## 2022-05-30 DIAGNOSIS — R778 Other specified abnormalities of plasma proteins: Secondary | ICD-10-CM | POA: Diagnosis not present

## 2022-05-30 DIAGNOSIS — Z8781 Personal history of (healed) traumatic fracture: Secondary | ICD-10-CM | POA: Diagnosis not present

## 2022-05-30 DIAGNOSIS — I10 Essential (primary) hypertension: Secondary | ICD-10-CM | POA: Diagnosis not present

## 2022-05-30 DIAGNOSIS — M810 Age-related osteoporosis without current pathological fracture: Secondary | ICD-10-CM | POA: Diagnosis not present

## 2022-06-30 DIAGNOSIS — Z85828 Personal history of other malignant neoplasm of skin: Secondary | ICD-10-CM | POA: Diagnosis not present

## 2022-06-30 DIAGNOSIS — L570 Actinic keratosis: Secondary | ICD-10-CM | POA: Diagnosis not present

## 2022-06-30 DIAGNOSIS — X32XXXD Exposure to sunlight, subsequent encounter: Secondary | ICD-10-CM | POA: Diagnosis not present

## 2022-06-30 DIAGNOSIS — Z08 Encounter for follow-up examination after completed treatment for malignant neoplasm: Secondary | ICD-10-CM | POA: Diagnosis not present

## 2022-06-30 DIAGNOSIS — D485 Neoplasm of uncertain behavior of skin: Secondary | ICD-10-CM | POA: Diagnosis not present

## 2022-10-20 DIAGNOSIS — X32XXXD Exposure to sunlight, subsequent encounter: Secondary | ICD-10-CM | POA: Diagnosis not present

## 2022-10-20 DIAGNOSIS — C4441 Basal cell carcinoma of skin of scalp and neck: Secondary | ICD-10-CM | POA: Diagnosis not present

## 2022-10-20 DIAGNOSIS — L570 Actinic keratosis: Secondary | ICD-10-CM | POA: Diagnosis not present

## 2022-10-20 DIAGNOSIS — D225 Melanocytic nevi of trunk: Secondary | ICD-10-CM | POA: Diagnosis not present

## 2022-10-20 DIAGNOSIS — Z1283 Encounter for screening for malignant neoplasm of skin: Secondary | ICD-10-CM | POA: Diagnosis not present

## 2022-11-30 DIAGNOSIS — M810 Age-related osteoporosis without current pathological fracture: Secondary | ICD-10-CM | POA: Diagnosis not present

## 2022-12-01 DIAGNOSIS — Z08 Encounter for follow-up examination after completed treatment for malignant neoplasm: Secondary | ICD-10-CM | POA: Diagnosis not present

## 2022-12-01 DIAGNOSIS — B078 Other viral warts: Secondary | ICD-10-CM | POA: Diagnosis not present

## 2022-12-01 DIAGNOSIS — Z85828 Personal history of other malignant neoplasm of skin: Secondary | ICD-10-CM | POA: Diagnosis not present

## 2022-12-01 DIAGNOSIS — L570 Actinic keratosis: Secondary | ICD-10-CM | POA: Diagnosis not present

## 2022-12-01 DIAGNOSIS — X32XXXD Exposure to sunlight, subsequent encounter: Secondary | ICD-10-CM | POA: Diagnosis not present

## 2022-12-15 DIAGNOSIS — R972 Elevated prostate specific antigen [PSA]: Secondary | ICD-10-CM | POA: Diagnosis not present

## 2022-12-22 DIAGNOSIS — N5201 Erectile dysfunction due to arterial insufficiency: Secondary | ICD-10-CM | POA: Diagnosis not present

## 2022-12-22 DIAGNOSIS — N401 Enlarged prostate with lower urinary tract symptoms: Secondary | ICD-10-CM | POA: Diagnosis not present

## 2022-12-22 DIAGNOSIS — R350 Frequency of micturition: Secondary | ICD-10-CM | POA: Diagnosis not present

## 2023-02-09 DIAGNOSIS — Z23 Encounter for immunization: Secondary | ICD-10-CM | POA: Diagnosis not present

## 2023-04-20 DIAGNOSIS — B078 Other viral warts: Secondary | ICD-10-CM | POA: Diagnosis not present

## 2023-04-20 DIAGNOSIS — X32XXXD Exposure to sunlight, subsequent encounter: Secondary | ICD-10-CM | POA: Diagnosis not present

## 2023-04-20 DIAGNOSIS — Z85828 Personal history of other malignant neoplasm of skin: Secondary | ICD-10-CM | POA: Diagnosis not present

## 2023-04-20 DIAGNOSIS — L570 Actinic keratosis: Secondary | ICD-10-CM | POA: Diagnosis not present

## 2023-04-20 DIAGNOSIS — Z08 Encounter for follow-up examination after completed treatment for malignant neoplasm: Secondary | ICD-10-CM | POA: Diagnosis not present

## 2023-05-04 DIAGNOSIS — I10 Essential (primary) hypertension: Secondary | ICD-10-CM | POA: Diagnosis not present

## 2023-05-04 DIAGNOSIS — M810 Age-related osteoporosis without current pathological fracture: Secondary | ICD-10-CM | POA: Diagnosis not present

## 2023-05-04 DIAGNOSIS — Z125 Encounter for screening for malignant neoplasm of prostate: Secondary | ICD-10-CM | POA: Diagnosis not present

## 2023-05-08 DIAGNOSIS — I10 Essential (primary) hypertension: Secondary | ICD-10-CM | POA: Diagnosis not present

## 2023-05-08 DIAGNOSIS — N400 Enlarged prostate without lower urinary tract symptoms: Secondary | ICD-10-CM | POA: Diagnosis not present

## 2023-05-08 DIAGNOSIS — Z0000 Encounter for general adult medical examination without abnormal findings: Secondary | ICD-10-CM | POA: Diagnosis not present

## 2023-05-08 DIAGNOSIS — M810 Age-related osteoporosis without current pathological fracture: Secondary | ICD-10-CM | POA: Diagnosis not present

## 2023-05-16 DIAGNOSIS — I10 Essential (primary) hypertension: Secondary | ICD-10-CM | POA: Diagnosis not present

## 2023-06-05 DIAGNOSIS — M810 Age-related osteoporosis without current pathological fracture: Secondary | ICD-10-CM | POA: Diagnosis not present

## 2023-06-05 DIAGNOSIS — Z7982 Long term (current) use of aspirin: Secondary | ICD-10-CM | POA: Diagnosis not present

## 2023-06-05 DIAGNOSIS — R778 Other specified abnormalities of plasma proteins: Secondary | ICD-10-CM | POA: Diagnosis not present

## 2023-06-05 DIAGNOSIS — Z1211 Encounter for screening for malignant neoplasm of colon: Secondary | ICD-10-CM | POA: Diagnosis not present

## 2023-06-05 DIAGNOSIS — I10 Essential (primary) hypertension: Secondary | ICD-10-CM | POA: Diagnosis not present

## 2023-06-05 DIAGNOSIS — Z1212 Encounter for screening for malignant neoplasm of rectum: Secondary | ICD-10-CM | POA: Diagnosis not present

## 2023-06-05 DIAGNOSIS — R748 Abnormal levels of other serum enzymes: Secondary | ICD-10-CM | POA: Diagnosis not present

## 2023-06-08 DIAGNOSIS — N401 Enlarged prostate with lower urinary tract symptoms: Secondary | ICD-10-CM | POA: Diagnosis not present

## 2023-06-08 DIAGNOSIS — R350 Frequency of micturition: Secondary | ICD-10-CM | POA: Diagnosis not present

## 2023-06-08 DIAGNOSIS — R972 Elevated prostate specific antigen [PSA]: Secondary | ICD-10-CM | POA: Diagnosis not present

## 2023-06-08 DIAGNOSIS — R748 Abnormal levels of other serum enzymes: Secondary | ICD-10-CM | POA: Diagnosis not present

## 2023-06-14 LAB — COLOGUARD: COLOGUARD: NEGATIVE

## 2023-06-21 DIAGNOSIS — R748 Abnormal levels of other serum enzymes: Secondary | ICD-10-CM | POA: Diagnosis not present

## 2023-06-21 DIAGNOSIS — M810 Age-related osteoporosis without current pathological fracture: Secondary | ICD-10-CM | POA: Diagnosis not present

## 2023-07-03 DIAGNOSIS — H35033 Hypertensive retinopathy, bilateral: Secondary | ICD-10-CM | POA: Diagnosis not present

## 2023-10-19 DIAGNOSIS — C44219 Basal cell carcinoma of skin of left ear and external auricular canal: Secondary | ICD-10-CM | POA: Diagnosis not present

## 2023-10-19 DIAGNOSIS — C4441 Basal cell carcinoma of skin of scalp and neck: Secondary | ICD-10-CM | POA: Diagnosis not present

## 2023-11-29 DIAGNOSIS — R972 Elevated prostate specific antigen [PSA]: Secondary | ICD-10-CM | POA: Diagnosis not present

## 2023-12-06 DIAGNOSIS — Z8616 Personal history of COVID-19: Secondary | ICD-10-CM | POA: Diagnosis not present

## 2023-12-06 DIAGNOSIS — R778 Other specified abnormalities of plasma proteins: Secondary | ICD-10-CM | POA: Diagnosis not present

## 2023-12-06 DIAGNOSIS — Z79899 Other long term (current) drug therapy: Secondary | ICD-10-CM | POA: Diagnosis not present

## 2023-12-06 DIAGNOSIS — R748 Abnormal levels of other serum enzymes: Secondary | ICD-10-CM | POA: Diagnosis not present

## 2023-12-06 DIAGNOSIS — I10 Essential (primary) hypertension: Secondary | ICD-10-CM | POA: Diagnosis not present

## 2023-12-06 DIAGNOSIS — M8580 Other specified disorders of bone density and structure, unspecified site: Secondary | ICD-10-CM | POA: Diagnosis not present

## 2023-12-06 DIAGNOSIS — E559 Vitamin D deficiency, unspecified: Secondary | ICD-10-CM | POA: Diagnosis not present

## 2023-12-06 DIAGNOSIS — Z7982 Long term (current) use of aspirin: Secondary | ICD-10-CM | POA: Diagnosis not present

## 2023-12-06 DIAGNOSIS — Z85828 Personal history of other malignant neoplasm of skin: Secondary | ICD-10-CM | POA: Diagnosis not present

## 2023-12-06 DIAGNOSIS — M810 Age-related osteoporosis without current pathological fracture: Secondary | ICD-10-CM | POA: Diagnosis not present

## 2023-12-06 NOTE — Progress Notes (Signed)
 Nurse visit today for Prolia injection. 2 identifiers and allergies verified. Prolia 60mg  given [place] in left upper arm. See MAR for medication administration details. Next appointment scheduled with patient for 6 months from now. Prolia contract explained and signed by patient.

## 2023-12-07 DIAGNOSIS — R972 Elevated prostate specific antigen [PSA]: Secondary | ICD-10-CM | POA: Diagnosis not present

## 2023-12-07 DIAGNOSIS — N401 Enlarged prostate with lower urinary tract symptoms: Secondary | ICD-10-CM | POA: Diagnosis not present

## 2023-12-07 DIAGNOSIS — R350 Frequency of micturition: Secondary | ICD-10-CM | POA: Diagnosis not present

## 2023-12-14 DIAGNOSIS — Z85828 Personal history of other malignant neoplasm of skin: Secondary | ICD-10-CM | POA: Diagnosis not present

## 2023-12-14 DIAGNOSIS — Z08 Encounter for follow-up examination after completed treatment for malignant neoplasm: Secondary | ICD-10-CM | POA: Diagnosis not present

## 2023-12-14 DIAGNOSIS — X32XXXD Exposure to sunlight, subsequent encounter: Secondary | ICD-10-CM | POA: Diagnosis not present

## 2023-12-14 DIAGNOSIS — L570 Actinic keratosis: Secondary | ICD-10-CM | POA: Diagnosis not present

## 2024-02-09 DIAGNOSIS — Z23 Encounter for immunization: Secondary | ICD-10-CM | POA: Diagnosis not present

## 2024-04-04 DIAGNOSIS — L570 Actinic keratosis: Secondary | ICD-10-CM | POA: Diagnosis not present

## 2024-04-04 DIAGNOSIS — X32XXXD Exposure to sunlight, subsequent encounter: Secondary | ICD-10-CM | POA: Diagnosis not present

## 2024-05-13 NOTE — Progress Notes (Addendum)
 "  Cardiology Office Note:   Date:  05/14/2024  ID:  LACOREY BRUSCA, DOB Sep 15, 1945, MRN [id number] PCP:  Clarice Nottingham, MD  [facility] Providers Cardiologist:  Wendel Haws, MD Referring MD: Clarice Nottingham, MD  Chief Complaint/Reason for Referral: PVCs ASSESSMENT:    1. PVC's (premature ventricular contractions)   2. Primary hypertension   3. CKD (chronic kidney disease) stage 2, GFR 60-89 ml/min   4. Palpitations     PLAN:   In order of problems listed above: PVCs: Will echocardiogram to evaluate further.  TSH checked by PCP.  Monitor in place.  Will reach out to PCP about conveying monitor results.  ADDENDUM 05/24/24: Monitor results demonstrated predominantly sinus rhythm with rare PACs, occasional PVCs and 8 runs of supraventricular tachycardia longest being 17 beats.  Patient triggered events corresponded with sinus rhythm, SVT, PACs, and PVCs.  TSH was within normal limits.  Hypertension: Continue amlodipine 10 mg, benazepril 20 mg, hydrochlorothiazide 25 mg.  BP well controlled today. CKD stage II: Continue benazepril 20 mg           Dispo:  Return in about 6 months (around 11/11/2024).       I spent 41 minutes reviewing all clinical data during and prior to this visit including all relevant imaging studies, laboratories, clinical information from other health systems and prior notes from both Cardiology and other specialties, interviewing the patient, conducting a complete physical examination, and coordinating care in order to formulate a comprehensive and personalized evaluation and treatment plan.   History of Present Illness:    FOCUSED PROBLEM LIST:   Hypertension CAC 0 Calcium score CT 2022 [facility] CKD stage II GFR 79 January 2026 Osteoporosis On Prolia  BMI 22 May 2024:  Patient consents to use of AI scribe. The patient is a 79 year old male with above listed medical problems referred for recommendations regarding incidentally noted PVCs.  Patient  was seen by his primary care provider recently.  An EKG was done which demonstrated normal sinus rhythm with occasional PVCs.  He is referred for further recommendations.     Current Medications: Active Medications[1]   Review of Systems:   Please see the history of present illness.    All other systems reviewed and are negative.     EKGs/Labs/Other Test Reviewed:   EKG: 2026 sinus rhythm with PVCs  EKG Interpretation Date/Time:  Tuesday May 14 2024 09:30:38 EST Ventricular Rate:  69 PR Interval:  138 QRS Duration:  86 QT Interval:  404 QTC Calculation: 432 R Axis:   9  Text Interpretation: Sinus rhythm with occasional Premature ventricular complexes and Premature atrial complexes No previous ECGs available Confirmed by Wendel Haws ([id number]) on 05/14/2024 9:34:37 AM        CARDIAC STUDIES: Refer to CV Procedures and Imaging Tabs   Risk Assessment/Calculations:          Physical Exam:   VS:  BP 130/70   Pulse 60   Ht 5' 10 (1.778 m)   Wt 149 lb 3.2 oz (67.7 kg)   SpO2 98%   BMI 21.41 kg/m        Wt Readings from Last 3 Encounters:  05/14/24 149 lb 3.2 oz (67.7 kg)  10/27/17 148 lb (67.1 kg)  10/05/16 144 lb (65.3 kg)      GENERAL:  No apparent distress, AOx3 HEENT:  No carotid bruits, +2 carotid impulses, no scleral icterus CAR: RRR no murmurs, gallops, rubs, or thrills RES:  Clear to auscultation bilaterally  ABD:  Soft, nontender, nondistended, positive bowel sounds x 4 VASC:  +2 radial pulses, +2 carotid pulses NEURO:  CN 2-12 grossly intact; motor and sensory grossly intact PSYCH:  No active depression or anxiety EXT:  No edema, ecchymosis, or cyanosis  Signed, Duy Lemming K Myeshia Fojtik, MD  05/14/2024 9:53 AM    [facility] [address] [place], [place], [place]  [address] Phone: [phone]; Fax: [phone]   Note:  This document was prepared using Dragon voice recognition software and may include unintentional dictation  errors.     [1]  Current Meds  Medication Sig   amLODipine (NORVASC) 10 MG tablet Take 10 mg by mouth daily.   benazepril (LOTENSIN) 20 MG tablet Take 20 mg by mouth daily.   calcium carbonate (OSCAL) 1500 (600 Ca) MG TABS tablet Take by mouth.   Cholecalciferol 25 MCG (1000 UT) tablet Vitamin D3 25 mcg (1,000 unit) tablet   1 tablet every day by oral route.   denosumab  (PROLIA ) 60 MG/ML SOSY injection Inject 60 mg into the skin every 6 (six) months.   HYDROcodone -acetaminophen  (NORCO/VICODIN) 5-325 MG tablet Take 1-2 tablets by mouth every 6 (six) hours as needed for moderate pain.   Omega-3 1000 MG CAPS Take by mouth.   "

## 2024-05-14 ENCOUNTER — Encounter: Payer: Self-pay | Admitting: Internal Medicine

## 2024-05-14 ENCOUNTER — Ambulatory Visit: Attending: Internal Medicine | Admitting: Internal Medicine

## 2024-05-14 VITALS — BP 130/70 | HR 60 | Ht 70.0 in | Wt 149.2 lb

## 2024-05-14 DIAGNOSIS — I10 Essential (primary) hypertension: Secondary | ICD-10-CM | POA: Diagnosis not present

## 2024-05-14 DIAGNOSIS — I493 Ventricular premature depolarization: Secondary | ICD-10-CM | POA: Diagnosis present

## 2024-05-14 DIAGNOSIS — N182 Chronic kidney disease, stage 2 (mild): Secondary | ICD-10-CM | POA: Diagnosis present

## 2024-05-14 DIAGNOSIS — R002 Palpitations: Secondary | ICD-10-CM | POA: Diagnosis present

## 2024-05-14 NOTE — Patient Instructions (Signed)
 Medication Instructions:  No medication changes were made at this visit. Continue current regimen.   *If you need a refill on your cardiac medications before your next appointment, please call your pharmacy*  Lab Work: None ordered today. If you have labs (blood work) drawn today and your tests are completely normal, you will receive your results only by: MyChart Message (if you have MyChart) OR A paper copy in the mail If you have any lab test that is abnormal or we need to change your treatment, we will call you to review the results.  Testing/Procedures: Your physician has requested that you have an echocardiogram. Echocardiography is a painless test that uses sound waves to create images of your heart. It provides your doctor with information about the size and shape of your heart and how well your hearts chambers and valves are working. This procedure takes approximately one hour. There are no restrictions for this procedure. Please do NOT wear cologne, perfume, aftershave, or lotions (deodorant is allowed). Please arrive 15 minutes prior to your appointment time.  Please note: We ask at that you not bring children with you during ultrasound (echo/ vascular) testing. Due to room size and safety concerns, children are not allowed in the ultrasound rooms during exams. Our front office staff cannot provide observation of children in our lobby area while testing is being conducted. An adult accompanying a patient to their appointment will only be allowed in the ultrasound room at the discretion of the ultrasound technician under special circumstances. We apologize for any inconvenience.   Follow-Up: At [facility], you and your health needs are our priority.  As part of our continuing mission to provide you with exceptional heart care, our providers are all part of one team.  This team includes your primary Cardiologist (physician) and Advanced Practice Providers or APPs (Physician  Assistants and Nurse Practitioners) who all work together to provide you with the care you need, when you need it.  Your next appointment:   6 month(s)  Provider:   Arun K Thukkani, MD

## 2024-05-24 ENCOUNTER — Encounter: Payer: Self-pay | Admitting: Internal Medicine

## 2024-05-24 NOTE — Telephone Encounter (Signed)
 Spoke with pt and went over result note. Pt stated he doesn't have any issues with symptoms from the extra beats/ SVT and he would like to hold off on sending in any medications. Dr. Parry recommendations for helping minimize the extra beats/ SVT were given to pt. Pt told to call our office if he does start becoming symptomatic. Pt agreed to plan and had no further questions at this time.

## 2024-06-19 ENCOUNTER — Ambulatory Visit (HOSPITAL_COMMUNITY)
